# Patient Record
Sex: Male | Born: 1975 | Race: White | Hispanic: No | Marital: Married | Smoking: Never smoker
Health system: Southern US, Community
[De-identification: ages and names within clinical notes are randomized; demographics above are authoritative.]

## PROBLEM LIST (undated history)

## (undated) ENCOUNTER — Ambulatory Visit (HOSPITAL_COMMUNITY): Admission: EM | Source: Home / Self Care

## (undated) VITALS — BP 115/86 | HR 82 | Temp 97.4°F | Resp 18 | Ht 72.0 in | Wt 201.0 lb

## (undated) DIAGNOSIS — G43909 Migraine, unspecified, not intractable, without status migrainosus: Secondary | ICD-10-CM

---

## 2020-04-04 ENCOUNTER — Ambulatory Visit (INDEPENDENT_AMBULATORY_CARE_PROVIDER_SITE_OTHER): Admitting: Psychology

## 2020-04-04 DIAGNOSIS — F332 Major depressive disorder, recurrent severe without psychotic features: Secondary | ICD-10-CM

## 2020-04-12 ENCOUNTER — Ambulatory Visit (INDEPENDENT_AMBULATORY_CARE_PROVIDER_SITE_OTHER): Admitting: Psychology

## 2020-04-12 DIAGNOSIS — F332 Major depressive disorder, recurrent severe without psychotic features: Secondary | ICD-10-CM | POA: Diagnosis not present

## 2020-04-19 ENCOUNTER — Ambulatory Visit (INDEPENDENT_AMBULATORY_CARE_PROVIDER_SITE_OTHER): Admitting: Psychology

## 2020-04-19 DIAGNOSIS — F332 Major depressive disorder, recurrent severe without psychotic features: Secondary | ICD-10-CM

## 2020-04-19 DIAGNOSIS — F4312 Post-traumatic stress disorder, chronic: Secondary | ICD-10-CM

## 2020-04-26 ENCOUNTER — Ambulatory Visit (INDEPENDENT_AMBULATORY_CARE_PROVIDER_SITE_OTHER): Admitting: Psychology

## 2020-04-26 DIAGNOSIS — F4312 Post-traumatic stress disorder, chronic: Secondary | ICD-10-CM

## 2020-04-26 DIAGNOSIS — F332 Major depressive disorder, recurrent severe without psychotic features: Secondary | ICD-10-CM | POA: Diagnosis not present

## 2020-05-10 ENCOUNTER — Ambulatory Visit (INDEPENDENT_AMBULATORY_CARE_PROVIDER_SITE_OTHER): Admitting: Psychology

## 2020-05-10 DIAGNOSIS — F332 Major depressive disorder, recurrent severe without psychotic features: Secondary | ICD-10-CM

## 2020-05-10 DIAGNOSIS — F4312 Post-traumatic stress disorder, chronic: Secondary | ICD-10-CM | POA: Diagnosis not present

## 2020-05-16 ENCOUNTER — Ambulatory Visit (INDEPENDENT_AMBULATORY_CARE_PROVIDER_SITE_OTHER): Admitting: Psychology

## 2020-05-16 DIAGNOSIS — F4312 Post-traumatic stress disorder, chronic: Secondary | ICD-10-CM

## 2020-05-16 DIAGNOSIS — F332 Major depressive disorder, recurrent severe without psychotic features: Secondary | ICD-10-CM

## 2020-05-23 ENCOUNTER — Ambulatory Visit (INDEPENDENT_AMBULATORY_CARE_PROVIDER_SITE_OTHER): Admitting: Psychology

## 2020-05-23 DIAGNOSIS — F332 Major depressive disorder, recurrent severe without psychotic features: Secondary | ICD-10-CM | POA: Diagnosis not present

## 2020-05-23 DIAGNOSIS — F4312 Post-traumatic stress disorder, chronic: Secondary | ICD-10-CM

## 2020-06-01 ENCOUNTER — Ambulatory Visit (INDEPENDENT_AMBULATORY_CARE_PROVIDER_SITE_OTHER): Admitting: Psychology

## 2020-06-01 DIAGNOSIS — F4312 Post-traumatic stress disorder, chronic: Secondary | ICD-10-CM | POA: Diagnosis not present

## 2020-06-01 DIAGNOSIS — F332 Major depressive disorder, recurrent severe without psychotic features: Secondary | ICD-10-CM

## 2020-06-08 ENCOUNTER — Ambulatory Visit (INDEPENDENT_AMBULATORY_CARE_PROVIDER_SITE_OTHER): Admitting: Psychology

## 2020-06-08 DIAGNOSIS — F332 Major depressive disorder, recurrent severe without psychotic features: Secondary | ICD-10-CM | POA: Diagnosis not present

## 2020-06-08 DIAGNOSIS — F4312 Post-traumatic stress disorder, chronic: Secondary | ICD-10-CM | POA: Diagnosis not present

## 2020-06-15 ENCOUNTER — Ambulatory Visit (INDEPENDENT_AMBULATORY_CARE_PROVIDER_SITE_OTHER): Admitting: Psychology

## 2020-06-15 DIAGNOSIS — F4312 Post-traumatic stress disorder, chronic: Secondary | ICD-10-CM | POA: Diagnosis not present

## 2020-06-15 DIAGNOSIS — F332 Major depressive disorder, recurrent severe without psychotic features: Secondary | ICD-10-CM

## 2020-06-22 ENCOUNTER — Ambulatory Visit (INDEPENDENT_AMBULATORY_CARE_PROVIDER_SITE_OTHER): Admitting: Psychology

## 2020-06-22 DIAGNOSIS — F332 Major depressive disorder, recurrent severe without psychotic features: Secondary | ICD-10-CM

## 2020-06-22 DIAGNOSIS — F4312 Post-traumatic stress disorder, chronic: Secondary | ICD-10-CM | POA: Diagnosis not present

## 2020-06-30 ENCOUNTER — Ambulatory Visit (INDEPENDENT_AMBULATORY_CARE_PROVIDER_SITE_OTHER): Admitting: Psychology

## 2020-06-30 DIAGNOSIS — F4312 Post-traumatic stress disorder, chronic: Secondary | ICD-10-CM

## 2020-06-30 DIAGNOSIS — F332 Major depressive disorder, recurrent severe without psychotic features: Secondary | ICD-10-CM

## 2020-07-07 ENCOUNTER — Ambulatory Visit (INDEPENDENT_AMBULATORY_CARE_PROVIDER_SITE_OTHER): Admitting: Psychology

## 2020-07-07 DIAGNOSIS — F4312 Post-traumatic stress disorder, chronic: Secondary | ICD-10-CM

## 2020-07-14 ENCOUNTER — Ambulatory Visit (INDEPENDENT_AMBULATORY_CARE_PROVIDER_SITE_OTHER): Admitting: Psychology

## 2020-07-14 DIAGNOSIS — F4312 Post-traumatic stress disorder, chronic: Secondary | ICD-10-CM | POA: Diagnosis not present

## 2020-07-14 DIAGNOSIS — F332 Major depressive disorder, recurrent severe without psychotic features: Secondary | ICD-10-CM | POA: Diagnosis not present

## 2020-07-21 ENCOUNTER — Ambulatory Visit (INDEPENDENT_AMBULATORY_CARE_PROVIDER_SITE_OTHER): Admitting: Psychology

## 2020-07-21 DIAGNOSIS — F4312 Post-traumatic stress disorder, chronic: Secondary | ICD-10-CM | POA: Diagnosis not present

## 2020-07-21 DIAGNOSIS — F332 Major depressive disorder, recurrent severe without psychotic features: Secondary | ICD-10-CM | POA: Diagnosis not present

## 2020-07-26 ENCOUNTER — Ambulatory Visit (INDEPENDENT_AMBULATORY_CARE_PROVIDER_SITE_OTHER): Admitting: Psychology

## 2020-07-26 DIAGNOSIS — F4312 Post-traumatic stress disorder, chronic: Secondary | ICD-10-CM | POA: Diagnosis not present

## 2020-07-26 DIAGNOSIS — F332 Major depressive disorder, recurrent severe without psychotic features: Secondary | ICD-10-CM | POA: Diagnosis not present

## 2020-08-03 ENCOUNTER — Ambulatory Visit: Admitting: Psychology

## 2020-08-10 ENCOUNTER — Ambulatory Visit (INDEPENDENT_AMBULATORY_CARE_PROVIDER_SITE_OTHER): Admitting: Psychology

## 2020-08-10 DIAGNOSIS — F332 Major depressive disorder, recurrent severe without psychotic features: Secondary | ICD-10-CM | POA: Diagnosis not present

## 2020-08-10 DIAGNOSIS — F4312 Post-traumatic stress disorder, chronic: Secondary | ICD-10-CM | POA: Diagnosis not present

## 2020-08-17 ENCOUNTER — Ambulatory Visit (INDEPENDENT_AMBULATORY_CARE_PROVIDER_SITE_OTHER): Admitting: Psychology

## 2020-08-17 DIAGNOSIS — F4312 Post-traumatic stress disorder, chronic: Secondary | ICD-10-CM | POA: Diagnosis not present

## 2020-08-17 DIAGNOSIS — F332 Major depressive disorder, recurrent severe without psychotic features: Secondary | ICD-10-CM

## 2020-08-30 ENCOUNTER — Ambulatory Visit (INDEPENDENT_AMBULATORY_CARE_PROVIDER_SITE_OTHER): Admitting: Psychology

## 2020-08-30 DIAGNOSIS — F332 Major depressive disorder, recurrent severe without psychotic features: Secondary | ICD-10-CM | POA: Diagnosis not present

## 2020-08-30 DIAGNOSIS — F4312 Post-traumatic stress disorder, chronic: Secondary | ICD-10-CM

## 2020-09-05 ENCOUNTER — Ambulatory Visit (INDEPENDENT_AMBULATORY_CARE_PROVIDER_SITE_OTHER): Admitting: Psychology

## 2020-09-05 DIAGNOSIS — F4312 Post-traumatic stress disorder, chronic: Secondary | ICD-10-CM | POA: Diagnosis not present

## 2020-09-05 DIAGNOSIS — F332 Major depressive disorder, recurrent severe without psychotic features: Secondary | ICD-10-CM | POA: Diagnosis not present

## 2020-09-14 ENCOUNTER — Ambulatory Visit (INDEPENDENT_AMBULATORY_CARE_PROVIDER_SITE_OTHER): Admitting: Psychology

## 2020-09-14 DIAGNOSIS — F4312 Post-traumatic stress disorder, chronic: Secondary | ICD-10-CM

## 2020-09-14 DIAGNOSIS — F332 Major depressive disorder, recurrent severe without psychotic features: Secondary | ICD-10-CM | POA: Diagnosis not present

## 2020-10-12 ENCOUNTER — Ambulatory Visit (INDEPENDENT_AMBULATORY_CARE_PROVIDER_SITE_OTHER): Admitting: Psychology

## 2020-10-12 DIAGNOSIS — F332 Major depressive disorder, recurrent severe without psychotic features: Secondary | ICD-10-CM

## 2020-10-12 DIAGNOSIS — F4312 Post-traumatic stress disorder, chronic: Secondary | ICD-10-CM

## 2020-11-08 ENCOUNTER — Ambulatory Visit (INDEPENDENT_AMBULATORY_CARE_PROVIDER_SITE_OTHER): Admitting: Psychology

## 2020-11-08 DIAGNOSIS — F4312 Post-traumatic stress disorder, chronic: Secondary | ICD-10-CM

## 2020-11-08 DIAGNOSIS — F332 Major depressive disorder, recurrent severe without psychotic features: Secondary | ICD-10-CM | POA: Diagnosis not present

## 2020-11-15 ENCOUNTER — Ambulatory Visit (INDEPENDENT_AMBULATORY_CARE_PROVIDER_SITE_OTHER): Admitting: Psychology

## 2020-11-15 DIAGNOSIS — F4312 Post-traumatic stress disorder, chronic: Secondary | ICD-10-CM

## 2020-11-15 DIAGNOSIS — F332 Major depressive disorder, recurrent severe without psychotic features: Secondary | ICD-10-CM | POA: Diagnosis not present

## 2020-11-16 ENCOUNTER — Ambulatory Visit: Admitting: Psychology

## 2020-11-30 ENCOUNTER — Ambulatory Visit (INDEPENDENT_AMBULATORY_CARE_PROVIDER_SITE_OTHER): Admitting: Psychology

## 2020-11-30 DIAGNOSIS — F4312 Post-traumatic stress disorder, chronic: Secondary | ICD-10-CM

## 2020-11-30 DIAGNOSIS — F332 Major depressive disorder, recurrent severe without psychotic features: Secondary | ICD-10-CM | POA: Diagnosis not present

## 2020-12-08 ENCOUNTER — Ambulatory Visit (INDEPENDENT_AMBULATORY_CARE_PROVIDER_SITE_OTHER): Admitting: Psychology

## 2020-12-08 DIAGNOSIS — F332 Major depressive disorder, recurrent severe without psychotic features: Secondary | ICD-10-CM | POA: Diagnosis not present

## 2020-12-08 DIAGNOSIS — F4312 Post-traumatic stress disorder, chronic: Secondary | ICD-10-CM | POA: Diagnosis not present

## 2020-12-13 ENCOUNTER — Ambulatory Visit (INDEPENDENT_AMBULATORY_CARE_PROVIDER_SITE_OTHER): Admitting: Psychology

## 2020-12-13 DIAGNOSIS — F332 Major depressive disorder, recurrent severe without psychotic features: Secondary | ICD-10-CM

## 2020-12-13 DIAGNOSIS — F4312 Post-traumatic stress disorder, chronic: Secondary | ICD-10-CM | POA: Diagnosis not present

## 2020-12-19 ENCOUNTER — Ambulatory Visit (INDEPENDENT_AMBULATORY_CARE_PROVIDER_SITE_OTHER): Admitting: Psychology

## 2020-12-19 DIAGNOSIS — F4312 Post-traumatic stress disorder, chronic: Secondary | ICD-10-CM | POA: Diagnosis not present

## 2020-12-19 DIAGNOSIS — F332 Major depressive disorder, recurrent severe without psychotic features: Secondary | ICD-10-CM | POA: Diagnosis not present

## 2020-12-27 ENCOUNTER — Ambulatory Visit (INDEPENDENT_AMBULATORY_CARE_PROVIDER_SITE_OTHER): Admitting: Psychology

## 2020-12-27 DIAGNOSIS — F4312 Post-traumatic stress disorder, chronic: Secondary | ICD-10-CM | POA: Diagnosis not present

## 2020-12-27 DIAGNOSIS — F332 Major depressive disorder, recurrent severe without psychotic features: Secondary | ICD-10-CM | POA: Diagnosis not present

## 2021-01-02 ENCOUNTER — Ambulatory Visit (INDEPENDENT_AMBULATORY_CARE_PROVIDER_SITE_OTHER): Admitting: Psychology

## 2021-01-02 DIAGNOSIS — F4312 Post-traumatic stress disorder, chronic: Secondary | ICD-10-CM

## 2021-01-02 DIAGNOSIS — F332 Major depressive disorder, recurrent severe without psychotic features: Secondary | ICD-10-CM

## 2021-01-10 ENCOUNTER — Ambulatory Visit (INDEPENDENT_AMBULATORY_CARE_PROVIDER_SITE_OTHER): Admitting: Psychology

## 2021-01-10 DIAGNOSIS — F332 Major depressive disorder, recurrent severe without psychotic features: Secondary | ICD-10-CM

## 2021-01-10 DIAGNOSIS — F4312 Post-traumatic stress disorder, chronic: Secondary | ICD-10-CM

## 2021-01-16 ENCOUNTER — Ambulatory Visit (INDEPENDENT_AMBULATORY_CARE_PROVIDER_SITE_OTHER): Admitting: Psychology

## 2021-01-16 DIAGNOSIS — F332 Major depressive disorder, recurrent severe without psychotic features: Secondary | ICD-10-CM | POA: Diagnosis not present

## 2021-01-16 DIAGNOSIS — F4312 Post-traumatic stress disorder, chronic: Secondary | ICD-10-CM

## 2021-01-25 ENCOUNTER — Ambulatory Visit (INDEPENDENT_AMBULATORY_CARE_PROVIDER_SITE_OTHER): Admitting: Psychology

## 2021-01-25 DIAGNOSIS — F332 Major depressive disorder, recurrent severe without psychotic features: Secondary | ICD-10-CM | POA: Diagnosis not present

## 2021-01-25 DIAGNOSIS — F4312 Post-traumatic stress disorder, chronic: Secondary | ICD-10-CM | POA: Diagnosis not present

## 2021-02-01 ENCOUNTER — Ambulatory Visit (INDEPENDENT_AMBULATORY_CARE_PROVIDER_SITE_OTHER): Admitting: Psychology

## 2021-02-01 DIAGNOSIS — F332 Major depressive disorder, recurrent severe without psychotic features: Secondary | ICD-10-CM

## 2021-02-01 DIAGNOSIS — F4312 Post-traumatic stress disorder, chronic: Secondary | ICD-10-CM

## 2021-02-09 ENCOUNTER — Ambulatory Visit (INDEPENDENT_AMBULATORY_CARE_PROVIDER_SITE_OTHER): Admitting: Psychology

## 2021-02-09 DIAGNOSIS — F332 Major depressive disorder, recurrent severe without psychotic features: Secondary | ICD-10-CM

## 2021-02-09 DIAGNOSIS — F4312 Post-traumatic stress disorder, chronic: Secondary | ICD-10-CM

## 2021-02-14 ENCOUNTER — Ambulatory Visit (INDEPENDENT_AMBULATORY_CARE_PROVIDER_SITE_OTHER): Admitting: Psychology

## 2021-02-14 DIAGNOSIS — F4312 Post-traumatic stress disorder, chronic: Secondary | ICD-10-CM

## 2021-02-14 DIAGNOSIS — F332 Major depressive disorder, recurrent severe without psychotic features: Secondary | ICD-10-CM

## 2021-02-16 ENCOUNTER — Inpatient Hospital Stay (HOSPITAL_COMMUNITY)
Admission: RE | Admit: 2021-02-16 | Discharge: 2021-02-23 | DRG: 885 | Disposition: A | Discharge status: Home / Self Care | Attending: Psychiatry | Admitting: Psychiatry

## 2021-02-16 ENCOUNTER — Other Ambulatory Visit: Payer: Self-pay

## 2021-02-16 ENCOUNTER — Encounter (HOSPITAL_COMMUNITY): Payer: Self-pay | Admitting: Psychiatry

## 2021-02-16 DIAGNOSIS — F429 Obsessive-compulsive disorder, unspecified: Secondary | ICD-10-CM | POA: Diagnosis present

## 2021-02-16 DIAGNOSIS — F419 Anxiety disorder, unspecified: Secondary | ICD-10-CM | POA: Diagnosis present

## 2021-02-16 DIAGNOSIS — F319 Bipolar disorder, unspecified: Secondary | ICD-10-CM | POA: Diagnosis present

## 2021-02-16 DIAGNOSIS — F411 Generalized anxiety disorder: Secondary | ICD-10-CM | POA: Diagnosis present

## 2021-02-16 DIAGNOSIS — I10 Essential (primary) hypertension: Secondary | ICD-10-CM | POA: Diagnosis present

## 2021-02-16 DIAGNOSIS — F3113 Bipolar disorder, current episode manic without psychotic features, severe: Secondary | ICD-10-CM | POA: Diagnosis present

## 2021-02-16 DIAGNOSIS — F332 Major depressive disorder, recurrent severe without psychotic features: Secondary | ICD-10-CM | POA: Diagnosis present

## 2021-02-16 DIAGNOSIS — G43909 Migraine, unspecified, not intractable, without status migrainosus: Secondary | ICD-10-CM | POA: Diagnosis present

## 2021-02-16 DIAGNOSIS — F909 Attention-deficit hyperactivity disorder, unspecified type: Secondary | ICD-10-CM | POA: Diagnosis present

## 2021-02-16 DIAGNOSIS — Z20822 Contact with and (suspected) exposure to covid-19: Secondary | ICD-10-CM | POA: Diagnosis present

## 2021-02-16 DIAGNOSIS — G4700 Insomnia, unspecified: Secondary | ICD-10-CM | POA: Diagnosis present

## 2021-02-16 DIAGNOSIS — Z79899 Other long term (current) drug therapy: Secondary | ICD-10-CM | POA: Diagnosis not present

## 2021-02-16 LAB — RESP PANEL BY RT-PCR (FLU A&B, COVID) ARPGX2
Influenza A by PCR: NEGATIVE
Influenza B by PCR: NEGATIVE
SARS Coronavirus 2 by RT PCR: NEGATIVE

## 2021-02-16 MED ORDER — LAMOTRIGINE 100 MG PO TABS
200.0000 mg | ORAL_TABLET | Freq: Two times a day (BID) | ORAL | Status: DC
  Administered 2021-02-16 – 2021-02-23 (×14): 200 mg via ORAL
  Filled 2021-02-16 (×8): qty 1
  Filled 2021-02-16: qty 2
  Filled 2021-02-16 (×6): qty 1
  Filled 2021-02-16: qty 2
  Filled 2021-02-16 (×2): qty 1

## 2021-02-16 MED ORDER — ALUM & MAG HYDROXIDE-SIMETH 200-200-20 MG/5ML PO SUSP: 30.0000 mL | ORAL | Status: DC | PRN

## 2021-02-16 MED ORDER — PROPRANOLOL HCL 40 MG PO TABS
40.0000 mg | ORAL_TABLET | Freq: Two times a day (BID) | ORAL | Status: DC
  Administered 2021-02-16 – 2021-02-23 (×14): 40 mg via ORAL
  Filled 2021-02-16 (×15): qty 1
  Filled 2021-02-16: qty 4
  Filled 2021-02-16: qty 1

## 2021-02-16 MED ORDER — ACETAMINOPHEN 325 MG PO TABS
650.0000 mg | ORAL_TABLET | Freq: Four times a day (QID) | ORAL | Status: DC | PRN
  Filled 2021-02-16: qty 2

## 2021-02-16 MED ORDER — TRAZODONE HCL 50 MG PO TABS
50.0000 mg | ORAL_TABLET | Freq: Once | ORAL | Status: AC | PRN
  Administered 2021-02-16: 50 mg via ORAL
  Filled 2021-02-16: qty 1

## 2021-02-16 MED ORDER — NICOTINE POLACRILEX 2 MG MT GUM
2.0000 mg | CHEWING_GUM | OROMUCOSAL | Status: DC | PRN
  Administered 2021-02-17 – 2021-02-23 (×25): 2 mg via ORAL
  Filled 2021-02-16 (×5): qty 1

## 2021-02-16 MED ORDER — RISPERIDONE 1 MG PO TABS
1.0000 mg | ORAL_TABLET | Freq: Every morning | ORAL | Status: DC
  Administered 2021-02-17 – 2021-02-19 (×3): 1 mg via ORAL
  Filled 2021-02-16 (×5): qty 1

## 2021-02-16 MED ORDER — HYDROXYZINE HCL 25 MG PO TABS
25.0000 mg | ORAL_TABLET | Freq: Three times a day (TID) | ORAL | Status: DC | PRN
  Administered 2021-02-16 – 2021-02-22 (×7): 25 mg via ORAL
  Filled 2021-02-16: qty 10
  Filled 2021-02-16 (×7): qty 1

## 2021-02-16 MED ORDER — RISPERIDONE 0.5 MG PO TABS
0.5000 mg | ORAL_TABLET | Freq: Every day | ORAL | Status: DC
  Administered 2021-02-16: 0.5 mg via ORAL
  Filled 2021-02-16 (×4): qty 1

## 2021-02-16 MED ORDER — MAGNESIUM HYDROXIDE 400 MG/5ML PO SUSP
30.0000 mL | Freq: Every day | ORAL | Status: DC | PRN
  Administered 2021-02-22: 30 mL via ORAL
  Filled 2021-02-16: qty 30
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
## 2021-02-16 NOTE — Progress Notes (Signed)
[facility] Group Notes:  (Nursing/MHT/Case Management/Adjunct)  Date:  02/16/2021  Time:  2015  Type of Therapy:  wrap up gorup  Participation Level:  Active  Participation Quality:  Appropriate, Attentive and Sharing  Affect:  Anxious  Cognitive:  Alert  Insight:  Improving  Engagement in Group:  Engaged  Modes of Intervention:  Clarification, Education and Support  Summary of Progress/Problems: Positive thinking and positive change were discussed.   Marcille Buffy 02/16/2021, 9:13 PM
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
## 2021-02-16 NOTE — Progress Notes (Signed)
Admission Note: Patient is a 45 year old male who presents as a walk-in accompanied by spouse with symptoms of paranoia and anxiety.  Patient believed his wife of 13 years is cheating on him with her coworker.  Patient denies SI/HI and audiovisual hallucinations.  Blames his wife for coming to the hospital.  States that he only want his wife to answer "yes or no" to his questions but refused.  States he is not going back home until she answers his questions.  Patient presents with anxious affect and mood.  Admission plan of care reviewed and consent for treatment signed.  Skin assessment and personal belongings completed.  Skin is dry and intact.  No contraband found.  Patient oriented to the unit, staff and room.  Routine safety checks initiated.  Verbalized understanding of unit rules/ protocols.  Patient is safe on the unit.

## 2021-02-16 NOTE — Tx Team (Signed)
Initial Treatment Plan 02/16/2021 5:35 PM Andrew Kerr VEH:[id number]    PATIENT STRESSORS: Health problems Marital or family conflict   PATIENT STRENGTHS: Ability for insight Communication skills Motivation for treatment/growth Supportive family/friends   PATIENT IDENTIFIED PROBLEMS: "Mental health issues"  Paranoia  Depression  Anxiety               DISCHARGE CRITERIA:  Ability to meet basic life and health needs Adequate post-discharge living arrangements Motivation to continue treatment in a less acute level of care  PRELIMINARY DISCHARGE PLAN: Attend aftercare/continuing care group Outpatient therapy Return to previous living arrangement  PATIENT/FAMILY INVOLVEMENT: This treatment plan has been presented to and reviewed with the patient, Andrew Kerr.  The patient and family have been given the opportunity to ask questions and make suggestions.  Clarene Critchley, RN 02/16/2021, 5:35 PM

## 2021-02-17 ENCOUNTER — Encounter (HOSPITAL_COMMUNITY): Payer: Self-pay | Admitting: Psychiatry

## 2021-02-17 DIAGNOSIS — F419 Anxiety disorder, unspecified: Secondary | ICD-10-CM

## 2021-02-17 DIAGNOSIS — F3113 Bipolar disorder, current episode manic without psychotic features, severe: Secondary | ICD-10-CM

## 2021-02-17 HISTORY — DX: Bipolar disorder, current episode manic without psychotic features, severe: F31.13

## 2021-02-17 HISTORY — DX: Anxiety disorder, unspecified: F41.9

## 2021-02-17 LAB — TSH: TSH: 3.708 u[IU]/mL (ref 0.350–4.500)

## 2021-02-17 LAB — COMPREHENSIVE METABOLIC PANEL
ALT: 19 U/L (ref 0–44)
AST: 19 U/L (ref 15–41)
Albumin: 4.6 g/dL (ref 3.5–5.0)
Alkaline Phosphatase: 59 U/L (ref 38–126)
Anion gap: 9 (ref 5–15)
BUN: 12 mg/dL (ref 6–20)
CO2: 26 mmol/L (ref 22–32)
Calcium: 9.2 mg/dL (ref 8.9–10.3)
Chloride: 104 mmol/L (ref 98–111)
Creatinine, Ser: 1.06 mg/dL (ref 0.61–1.24)
GFR, Estimated: >60 mL/min (ref >60)
Glucose, Bld: 112 mg/dL — ABNORMAL HIGH (ref 70–99)
Potassium: 3.5 mmol/L (ref 3.5–5.1)
Sodium: 139 mmol/L (ref 135–145)
Total Bilirubin: 0.7 mg/dL (ref 0.3–1.2)
Total Protein: 7.4 g/dL (ref 6.5–8.1)

## 2021-02-17 LAB — CBC
HCT: 43.7 % (ref 39.0–52.0)
Hemoglobin: 14.9 g/dL (ref 13.0–17.0)
MCH: 32.3 pg (ref 26.0–34.0)
MCHC: 34.1 g/dL (ref 30.0–36.0)
MCV: 94.8 fL (ref 80.0–100.0)
Platelets: 274 10*3/uL (ref 150–400)
RBC: 4.61 MIL/uL (ref 4.22–5.81)
RDW: 12.4 % (ref 11.5–15.5)
WBC: 4.7 10*3/uL (ref 4.0–10.5)
nRBC: 0 % (ref 0.0–0.2)

## 2021-02-17 LAB — RAPID URINE DRUG SCREEN, HOSP PERFORMED
Amphetamines: NOT DETECTED
Barbiturates: NOT DETECTED
Benzodiazepines: NOT DETECTED
Cocaine: NOT DETECTED
Opiates: NOT DETECTED
Tetrahydrocannabinol: NOT DETECTED

## 2021-02-17 LAB — HEMOGLOBIN A1C
Hgb A1c MFr Bld: 5.3 % (ref 4.8–5.6)
Mean Plasma Glucose: 105.41 mg/dL

## 2021-02-17 LAB — LIPID PANEL
Cholesterol: 202 mg/dL — ABNORMAL HIGH (ref 0–200)
HDL: 36 mg/dL — ABNORMAL LOW (ref >40)
LDL Cholesterol: 146 mg/dL — ABNORMAL HIGH (ref 0–99)
Total CHOL/HDL Ratio: 5.6 RATIO
Triglycerides: 101 mg/dL (ref <150)
VLDL: 20 mg/dL (ref 0–40)

## 2021-02-17 MED ORDER — RISPERIDONE 1 MG PO TABS
1.0000 mg | ORAL_TABLET | Freq: Every day | ORAL | Status: DC
  Administered 2021-02-17 – 2021-02-19 (×3): 1 mg via ORAL
  Filled 2021-02-17 (×4): qty 1

## 2021-02-17 NOTE — B[facility] Group Notes (Signed)
  Type of Therapy and Topic: Group Therapy: Anger Management   Participation: Active  Description of Group: In this group, patients will learn helpful strategies and techniques to manage anger, express anger in alternative ways, change hostile attitudes, and prevent aggressive acts, such as verbal abuse and violence.This group will be process-oriented and eductional, with patients participating in exploration of their own experiences as well as giving and receiving support and challenge from other group members.  Therapeutic Goals: 1. Patient will learn to manage anger. 2. Patient will learn to stop violence or the threat of violence. 3. Patient will learn to develop self control over thoughts and actions. 4. Patient will receive support and feedback from others  CSW provided worksheet packets for group members and answered any questions that were asked during this time.   Therapeutic Modalities: Cognitive Behavioral Therapy Solution Focused Therapy Motivational Interviewing

## 2021-02-17 NOTE — Tx Team (Signed)
Interdisciplinary Treatment and Diagnostic Plan Update  02/17/2021 Time of Session:  Jatin Naumann MRN: [id number]  Principal Diagnosis: Bipolar I disorder, current or most recent episode manic, severe ([place])  Secondary Diagnoses: Principal Problem:   Bipolar I disorder, current or most recent episode manic, severe ([place]) Active Problems:   MDD (major depressive disorder), recurrent episode, severe ([place])   Anxiety disorder, unspecified   Current Medications:  Current Facility-Administered Medications  Medication Dose Route Frequency Provider Last Rate Last Admin  . acetaminophen (TYLENOL) tablet 650 mg  650 mg Oral Q6H PRN Revonda Humphrey, NP      . alum & mag hydroxide-simeth (MAALOX/MYLANTA) 200-200-20 MG/5ML suspension 30 mL  30 mL Oral Q4H PRN Revonda Humphrey, NP      . hydrOXYzine (ATARAX/VISTARIL) tablet 25 mg  25 mg Oral TID PRN Revonda Humphrey, NP   25 mg at 02/16/21 2119  . lamoTRIgine (LAMICTAL) tablet 200 mg  200 mg Oral BID Revonda Humphrey, NP   200 mg at 02/17/21 0748  . magnesium hydroxide (MILK OF MAGNESIA) suspension 30 mL  30 mL Oral Daily PRN Revonda Humphrey, NP      . nicotine polacrilex (NICORETTE) gum 2 mg  2 mg Oral PRN Lindell Spar I, NP   2 mg at 02/17/21 0748  . propranolol (INDERAL) tablet 40 mg  40 mg Oral BID Revonda Humphrey, NP   40 mg at 02/17/21 0747  . risperiDONE (RISPERDAL) tablet 1 mg  1 mg Oral q morning Revonda Humphrey, NP      . risperiDONE (RISPERDAL) tablet 1 mg  1 mg Oral QHS Arthor Captain, MD       PTA Medications: Medications Prior to Admission  Medication Sig Dispense Refill Last Dose  . lamoTRIgine (LAMICTAL) 200 MG tablet Take 200 mg by mouth 2 (two) times daily.     . propranolol (INDERAL) 20 MG tablet Take 40 mg by mouth 2 (two) times daily.     . risperiDONE (RISPERDAL) 1 MG tablet Take 0.5 mg by mouth in the morning and at bedtime. Take one tablet every morning and half a tablet at bedtime.     Marland Kitchen VYVANSE 50 MG CHEW  Chew 25 mg by mouth 2 (two) times daily at 8am and 2pm.       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   Physician Treatment Plan for Primary Diagnosis: Bipolar I disorder, current or most recent episode manic, severe ([place]) Long Term Goal(s): Improvement in symptoms so as ready for discharge   Short Term Goals: Ability to identify changes in lifestyle to reduce recurrence of condition will improve Ability to verbalize feelings will improve Ability to demonstrate self-control will improve Ability to identify and develop effective coping behaviors will improve Ability to identify triggers associated with substance abuse/mental health issues will improve  Medication Management: Evaluate patient's response, side effects, and tolerance of medication regimen.  Therapeutic Interventions: 1 to 1 sessions, Unit Group sessions and Medication administration.  Evaluation of Outcomes: Not Met  Physician Treatment Plan for Secondary Diagnosis: Principal Problem:   Bipolar I disorder, current or most recent episode manic, severe ([place]) Active Problems:   MDD (major depressive disorder), recurrent episode, severe ([place])   Anxiety disorder, unspecified  Long Term Goal(s): Improvement in symptoms  so as ready for discharge   Short Term Goals: Ability to identify changes in lifestyle to reduce recurrence of condition will improve Ability to verbalize feelings will improve Ability to demonstrate self-control will improve Ability to identify and develop effective coping behaviors will improve Ability to identify triggers associated with substance abuse/mental health issues will improve     Medication Management:  Evaluate patient's response, side effects, and tolerance of medication regimen.  Therapeutic Interventions: 1 to 1 sessions, Unit Group sessions and Medication administration.  Evaluation of Outcomes: Not Met   RN Treatment Plan for Primary Diagnosis: Bipolar I disorder, current or most recent episode manic, severe ([place]) Long Term Goal(s): Knowledge of disease and therapeutic regimen to maintain health will improve  Short Term Goals: Ability to participate in decision making will improve, Ability to verbalize feelings will improve and Ability to identify and develop effective coping behaviors will improve  Medication Management: RN will administer medications as ordered by provider, will assess and evaluate patient's response and provide education to patient for prescribed medication. RN will report any adverse and/or side effects to prescribing provider.  Therapeutic Interventions: 1 on 1 counseling sessions, Psychoeducation, Medication administration, Evaluate responses to treatment, Monitor vital signs and CBGs as ordered, Perform/monitor CIWA, COWS, AIMS and Fall Risk screenings as ordered, Perform wound care treatments as ordered.  Evaluation of Outcomes: Not Met   LCSW Treatment Plan for Primary Diagnosis: Bipolar I disorder, current or most recent episode manic, severe ([place]) Long Term Goal(s): Safe transition to appropriate next level of care at discharge, Engage patient in therapeutic group addressing interpersonal concerns.  Short Term Goals: Engage patient in aftercare planning with referrals and resources, Increase social support and Increase emotional regulation  Therapeutic Interventions: Assess for all discharge needs, 1 to 1 time with Social worker, Explore available resources and support systems, Assess for adequacy in community support network, Educate family and significant other(s) on suicide prevention, Complete Psychosocial Assessment, Interpersonal group  therapy.  Evaluation of Outcomes: Not Met   Progress in Treatment: Attending groups: No. Participating in groups: No. Taking medication as prescribed: Yes. Toleration medication: Yes. Family/Significant other contact made: No, will contact:  pt's wife Patient understands diagnosis: No. Discussing patient identified problems/goals with staff: Yes. Medical problems stabilized or resolved: Yes. Denies suicidal/homicidal ideation: Yes. Issues/concerns per patient self-inventory: No. Other: None  New problem(s) identified: No, Describe:  None  New Short Term/Long Term Goal(s):medication stabilization, elimination of SI thoughts, development of comprehensive mental wellness plan.  Patient Goals:  "to go home"  Discharge Plan or Barriers: Patient recently admitted. CSW will continue to follow and assess for appropriate referrals and possible discharge planning.  Reason for Continuation of Hospitalization: Aggression Mania Medication stabilization  Estimated Length of Stay: 3-5 days  Attendees: Patient: Andrew Kerr 02/17/2021   Physician:  02/17/2021   Nursing:  02/17/2021   RN Care Manager: 02/17/2021   Social Worker: Toney Reil, Latanya Presser 02/17/2021   Recreational Therapist:  02/17/2021   Other:  02/17/2021   Other:  02/17/2021   Other: 02/17/2021       Scribe for Treatment Team: Mliss Fritz, Latanya Presser 02/17/2021 1:56 PM

## 2021-02-17 NOTE — B[facility] Counselor (Signed)
Adult Comprehensive Assessment  Patient ID: Andrew Kerr, male   DOB: Jan 19, 1976, 45 y.o.   MRN: [id number]  Information Source: Information source: Patient  Current Stressors:  Patient states their primary concerns and needs for treatment are:: "Issues with my wife, ongoing mental health issues. I saw 51 messages between my wife and another man." Patient states their goals for this hospitilization and ongoing recovery are:: "To get out"  Living/Environment/Situation:  Living Arrangements: Spouse/significant other,Children Who else lives in the home?: wife and 9 children  Family History:  Marital status: Married Number of Years Married:  (unknown) What types of issues is patient dealing with in the relationship?: Instrusive and racing  thoughts toward spouse Additional relationship information: unknown Are you sexually active?:  (unknown) What is your sexual orientation?: hetero sexual Has your sexual activity been affected by drugs, alcohol, medication, or emotional stress?: unknown Does patient have children?: Yes How many children?: 9 How is patient's relationship with their children?: Per spouse....children are afraid of patien when he displays mood unastability  Childhood History:  By whom was/is the patient raised?: Both parents Additional childhood history information: unknown Description of patient's relationship with caregiver when they were a child: unknown Patient's description of current relationship with people who raised him/her: unknown How were you disciplined when you got in trouble as a child/adolescent?: unknown Does patient have siblings?:  (unknown) Did patient suffer any verbal/emotional/physical/sexual abuse as a child?:  (unknown) Did patient suffer from severe childhood neglect?:  (unknown) Has patient ever been sexually abused/assaulted/raped as an adolescent or adult?:  (unknown) Was the patient ever a victim of a crime or a disaster?:   (unknown) Witnessed domestic violence?:  (unknown) Has patient been affected by domestic violence as an adult?:  (unknown)  Education:     Employment/Work Situation:   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 What is the longest time patient has a held a job?: unknown Where was the patient employed at that time?: [employer] in [place] Has patient ever been in the [employer]?: Yes (Describe in comment) Field seismologist)  Financial Resources:   Financial resources: Private insurance,Income from spouse  Alcohol/Substance Abuse:      Social Support System:      Leisure/Recreation:   Do You Have Hobbies?:  (unknown)  Strengths/Needs:      Discharge Plan:   Currently receiving community mental health services: Yes ([facility]  Summary/Recommendations:  CSW was able to gather little information from pt due to pt obsessing over his wife and the texts with this man. Pt was unable to be redirected. Pt has poor insight on his current state of mental health. While here, Gentry Roch can benefit from crisis stabilization, medication management, therapeutic milieu, and referrals for services.    Felizardo Hoffmann. 02/17/2021

## 2021-02-17 NOTE — Progress Notes (Signed)
   02/17/21 0500  Sleep  Number of Hours 6

## 2021-02-17 NOTE — Progress Notes (Signed)
[facility] presented to serve patient IVC this evening 02/17/21. Three copies obtained and placed on front of patient chart.  
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
## 2021-02-17 NOTE — Progress Notes (Signed)
Pt stated he was glad to be here, pt wants to get his medications right. Pt stated he has sleep apnea and has C-pap machine wife can bring in tomorrow. Pt HOB elevated  the patient given PRN Vistaril and  1 X Trazodone per MAR .     02/17/21 0000  Psych Admission Type (Psych Patients Only)  Admission Status Voluntary  Psychosocial Assessment  Patient Complaints Anxiety  Eye Contact Brief  Facial Expression Anxious  Affect Anxious  Speech Logical/coherent  Interaction Assertive  Motor Activity Slow  Appearance/Hygiene Unremarkable  Behavior Characteristics Cooperative  Mood Anxious;Depressed  Thought Process  Coherency WDL  Content WDL  Delusions Paranoid  Perception Derealization  Hallucination None reported or observed  Judgment Impaired  Confusion None  Danger to Self  Current suicidal ideation? Denies  Danger to Others  Danger to Others None reported or observed

## 2021-02-17 NOTE — Progress Notes (Signed)
Pt was IVC'd during the day. Pt stated he was doing ok and anxious about starting the new medication tomorrow    02/17/21 2200  Psych Admission Type (Psych Patients Only)  Admission Status Involuntary  Psychosocial Assessment  Patient Complaints Anxiety;Worrying  Eye Contact Fair  Facial Expression Anxious;Pensive  Affect Anxious  Speech Press photographer  Appearance/Hygiene Unremarkable  Behavior Characteristics Cooperative  Mood Anxious;Pleasant  Thought Process  Coherency Concrete thinking  Content Blaming others  Delusions None reported or observed  Perception WDL  Hallucination None reported or observed  Judgment Poor  Confusion None  Danger to Self  Current suicidal ideation? Denies  Danger to Others  Danger to Others None reported or observed

## 2021-02-17 NOTE — Progress Notes (Signed)
Progress note    02/17/21 0748  Psych Admission Type (Psych Patients Only)  Admission Status Voluntary  Psychosocial Assessment  Patient Complaints Anxiety;Worrying  Eye Contact Fair  Facial Expression Anxious;Pensive  Affect Anxious  Speech Press photographer  Appearance/Hygiene Unremarkable  Behavior Characteristics Cooperative;Appropriate to situation;Anxious;Fidgety  Mood Anxious;Pleasant  Thought Process  Coherency Concrete thinking  Content Blaming others  Delusions None reported or observed  Perception WDL  Hallucination None reported or observed  Judgment Poor  Confusion None  Danger to Self  Current suicidal ideation? Denies  Danger to Others  Danger to Others None reported or observed

## 2021-02-17 NOTE — Progress Notes (Signed)
Recreation Therapy Notes  Date:  4.29.22 Time: 0935 Location: [address] Hall Dayroom  Group Topic: Stress Management  Goal Area(s) Addresses:  Patient will identify positive stress management techniques. Patient will identify benefits of using stress management post d/c.  Behavioral Response: Engaged  Intervention: Stress Management  Activity:  Meditation.  LRT played a meditation that focused on calming anxiety.  Patients were to listen as the meditation played and focus on their breathing, releasing any tension and what they are feeling.    Education:  Stress Management, Discharge Planning.   Education Outcome: Acknowledges Education  Clinical Observations/Feedback: Pt attended and participated in group session.    Caroll Rancher, LRT/CTRS         Caroll Rancher A 02/17/2021 11:20 AM

## 2021-02-17 NOTE — Plan of Care (Signed)
  Problem: Education: Goal: Knowledge of [facility] Education information/materials will improve Outcome: Progressing Goal: Emotional status will improve Outcome: Progressing Goal: Mental status will improve Outcome: Progressing Goal: Verbalization of understanding the information provided will improve Outcome: Progressing   

## 2021-02-18 DIAGNOSIS — F3113 Bipolar disorder, current episode manic without psychotic features, severe: Secondary | ICD-10-CM

## 2021-02-18 MED ORDER — LITHIUM CARBONATE 300 MG PO CAPS
300.0000 mg | ORAL_CAPSULE | Freq: Two times a day (BID) | ORAL | Status: DC
  Administered 2021-02-18 – 2021-02-20 (×4): 300 mg via ORAL
  Filled 2021-02-18 (×6): qty 1

## 2021-02-18 MED ORDER — TRAZODONE HCL 50 MG PO TABS
50.0000 mg | ORAL_TABLET | Freq: Every evening | ORAL | Status: DC | PRN
  Administered 2021-02-18 – 2021-02-22 (×5): 50 mg via ORAL
  Filled 2021-02-18 (×4): qty 1

## 2021-02-18 NOTE — [facility] Group Notes (Signed)
.  Psychoeducational Group Note  Date: 02/18/2021 Time: 0900-1000    Goal Setting   Purpose of Group: This group helps to provide patients with the steps of setting a goal that is specific, measurable, attainable, realistic and time specific. A discussion on how we keep ourselves stuck with negative self talk.    Participation Level:  Active  Participation Quality:  Appropriate  Affect:  Appropriate  Cognitive:  Appropriate  Insight:  Improving  Engagement in Group:  Engaged  Additional Comments:  Pt rates his energy as a 7/10. Pt states "I was believing things that are not true. I still am not sure. People are telling me things that I believe are true, are not true".  Dione Housekeeper

## 2021-02-18 NOTE — [facility] Group Notes (Signed)
Psychoeducational Group Note    Date:02/18/2021 Time: 1300-1400    Life Skills:  A group where two lists are made. What people need and what are things that we do that are healthy. The lists are developed by the patients and it is explained that we often do the actions that are not healthy to get our list of needs met.   Purpose of Group: . The group focus' on teaching patients on how to identify their needs and how to develop the coping skills needed to get their needs met  Participation Level:  Active  Participation Quality:  Appropriate  Affect:  Appropriate  Cognitive:  Oriented  Insight:  Improving  Engagement in Group:  Engaged  Additional Comments: Pt came late to the group and then was taken out for evaluation by the Doctor. When in the room, Pt interacted and contributed to the colloection of the two lists.   Andrew Kerr

## 2021-02-18 NOTE — Progress Notes (Signed)
[facility] Group Notes:  (Nursing/MHT/Case Management/Adjunct)  Date:  02/18/2021  Time:  2030 Type of Therapy:  wrap up group  Participation Level:  Active  Participation Quality:  Appropriate, Attentive, Sharing and Supportive  Affect:  Depressed and Irritable  Cognitive:  Alert  Insight:  Improving  Engagement in Group:  Engaged  Modes of Intervention:  Clarification, Education and Support  Summary of Progress/Problems: Positive thinking and self-care were discussed.   Andrew Kerr 02/18/2021, 10:45 PM

## 2021-02-18 NOTE — Progress Notes (Signed)
   02/18/21 1700  Vital Signs  Pulse Rate 95  BP 107/79  BP Method Automatic   D: Patient denies SI/HI/AVH. Patient rated anxiety 9/10 and depression 8/10. Pt  Out in open areas and was social with staff and peers.  A:  Patient took scheduled medicine.  Pt.'s nicotine cravings relieved by nicorette. Support and encouragement provided Routine safety checks conducted every 15 minutes. Patient  Informed to notify staff with any concerns.   R: Safety maintained.

## 2021-02-18 NOTE — Progress Notes (Signed)
   02/18/21 0500  Sleep  Number of Hours 6.75   
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
## 2021-02-19 NOTE — Progress Notes (Signed)
   02/19/21 2110  Psych Admission Type (Psych Patients Only)  Admission Status Involuntary  Psychosocial Assessment  Patient Complaints None  Eye Contact Fair  Facial Expression Flat  Affect Appropriate to circumstance;Flat  Speech Logical/coherent  Interaction Assertive  Motor Activity Other (Comment)  Appearance/Hygiene Unremarkable  Behavior Characteristics Cooperative  Mood Pleasant  Thought Process  Coherency WDL  Content WDL  Delusions None reported or observed  Perception WDL  Hallucination None reported or observed  Judgment Poor  Confusion None  Danger to Self  Current suicidal ideation? Denies  Danger to Others  Danger to Others None reported or observed    02/19/21 2110  Psych Admission Type (Psych Patients Only)  Admission Status Involuntary  Psychosocial Assessment  Patient Complaints None  Eye Contact Fair  Facial Expression Flat  Affect Appropriate to circumstance;Flat  Speech Logical/coherent  Interaction Assertive  Motor Activity Other (Comment)  Appearance/Hygiene Unremarkable  Behavior Characteristics Cooperative  Mood Pleasant  Thought Process  Coherency WDL  Content WDL  Delusions None reported or observed  Perception WDL  Hallucination None reported or observed  Judgment Poor  Confusion None  Danger to Self  Current suicidal ideation? Denies  Danger to Others  Danger to Others None reported or observed

## 2021-02-19 NOTE — [facility] Group Notes (Signed)
Adult Psychoeducational Group Not Date:  02/19/2021 Time:  0900-1045 Group Topic/Focus: PROGRESSIVE RELAXATION. A group where deep breathing is taught and tensing and relaxation muscle groups is used. Imagery is used as well.  Pts are asked to imagine 3 pillars that hold them up when they are not able to hold themselves up.  Participation Level:  Active  Participation Quality:  Appropriate  Affect:  Appropriate  Cognitive:  Oriented  Insight: Improving  Engagement in Group:  Engaged  Modes of Intervention:  Activity, Discussion, Education, and Support  Additional Comments:  Pt rates his energy at a 4/10. States what holds him up is his marriage, his family and his faith  Dione Housekeeper
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
## 2021-02-19 NOTE — Progress Notes (Signed)
   02/19/21 0628  Vital Signs  Temp 97.7 F (36.5 C)  Temp Source Oral  Pulse Rate (!) 115  Resp 16  BP 105/82  BP Location Left Arm  BP Method Automatic  Patient Position (if appropriate) Standing   D: Patient denies SI/HI/AVH. Pt. Rated anxiety 6/10 and depression 5/10. Pt. Out in open areas and attended group. A:  Patient took scheduled medicine. Cravings for nicotine were relieved by nicotine gum Support and encouragement provided Routine safety checks conducted every 15 minutes. Patient  Informed to notify staff with any concerns.  R:  Safety maintained.

## 2021-02-19 NOTE — Progress Notes (Signed)
Pt visible on the unit this evening. Pt stated he did not sleep well the night before without the Trazodone. Pt given PRN Trazodone and Vistaril per MAR with HS medication    02/19/21 0000  Psych Admission Type (Psych Patients Only)  Admission Status Involuntary  Psychosocial Assessment  Patient Complaints Anxiety  Eye Contact Fair  Facial Expression Anxious;Pensive  Affect Anxious  Speech Press photographer  Appearance/Hygiene Unremarkable  Behavior Characteristics Cooperative  Mood Anxious  Thought Process  Coherency WDL  Content Preoccupation (preoccupied with medicine)  Delusions None reported or observed  Perception WDL  Hallucination None reported or observed  Judgment Poor  Confusion None  Danger to Self  Current suicidal ideation? Denies  Danger to Others  Danger to Others None reported or observed

## 2021-02-19 NOTE — Progress Notes (Signed)
   02/19/21 2110  COVID-19 Daily Checkoff  Have you had a fever (temp > 37.80C/100F)  in the past 24 hours?  No  If you have had runny nose, nasal congestion, sneezing in the past 24 hours, has it worsened? No  COVID-19 EXPOSURE  Have you traveled outside the state in the past 14 days? No  Have you been in contact with someone with a confirmed diagnosis of COVID-19 or PUI in the past 14 days without wearing appropriate PPE? No  Have you been living in the same home as a person with confirmed diagnosis of COVID-19 or a PUI (household contact)? No  Have you been diagnosed with COVID-19? No

## 2021-02-19 NOTE — Progress Notes (Signed)
   02/19/21 0500  Sleep  Number of Hours 5.75

## 2021-02-20 MED ORDER — LITHIUM CARBONATE 150 MG PO CAPS
450.0000 mg | ORAL_CAPSULE | Freq: Two times a day (BID) | ORAL | Status: DC
  Administered 2021-02-20 – 2021-02-23 (×6): 450 mg via ORAL
  Filled 2021-02-20 (×8): qty 3

## 2021-02-20 MED ORDER — RISPERIDONE 0.5 MG PO TABS
1.0000 mg | ORAL_TABLET | Freq: Every morning | ORAL | Status: DC
  Administered 2021-02-21 – 2021-02-23 (×3): 1 mg via ORAL
  Filled 2021-02-20 (×5): qty 1

## 2021-02-20 MED ORDER — RISPERIDONE 1 MG PO TABS
1.0000 mg | ORAL_TABLET | Freq: Once | ORAL | Status: AC
  Administered 2021-02-20: 1 mg via ORAL
  Filled 2021-02-20: qty 1

## 2021-02-20 MED ORDER — LITHIUM CARBONATE 150 MG PO CAPS
150.0000 mg | ORAL_CAPSULE | Freq: Once | ORAL | Status: AC
  Administered 2021-02-20: 150 mg via ORAL
  Filled 2021-02-20 (×2): qty 1

## 2021-02-20 MED ORDER — RISPERIDONE 1 MG PO TABS
1.5000 mg | ORAL_TABLET | Freq: Every day | ORAL | Status: DC
  Administered 2021-02-20 – 2021-02-22 (×3): 1.5 mg via ORAL
  Filled 2021-02-20 (×4): qty 1

## 2021-02-20 NOTE — Progress Notes (Signed)
Progress note    02/20/21 0807  Psych Admission Type (Psych Patients Only)  Admission Status Involuntary  Psychosocial Assessment  Patient Complaints Anxiety  Eye Contact Fair  Facial Expression Anxious  Affect Appropriate to circumstance  Speech Logical/coherent  Interaction Assertive  Motor Activity Slow  Appearance/Hygiene Unremarkable  Behavior Characteristics Cooperative;Appropriate to situation;Anxious  Mood Anxious;Pleasant  Thought Process  Coherency WDL  Content WDL  Delusions None reported or observed  Perception WDL  Hallucination None reported or observed  Judgment WDL  Confusion None  Danger to Self  Current suicidal ideation? Denies  Danger to Others  Danger to Others None reported or observed

## 2021-02-20 NOTE — Progress Notes (Signed)
[facility] MD Progress Note  02/20/2021 5:29 PM Andrew Kerr  MRN:  [id number]   Subjective: "I am not going down that road of worst-case scenarios."  Objective: Medical record reviewed.  Patient's case discussed in detail with members of the treatment team.  I met with and evaluated the patient the office on the unit today.  He reports that he is better and describes his mood is "pretty good."  Patient initially states that he is no longer going down the road of worst case scenarios with regard to his wife having an inappropriate relationship.  However he perseverates on the fact that she has not answered various "yes no questions" about her text messages or acknowledged to him that she has done anything inappropriate.  He has to be repeatedly redirected to talk about other topics besides his concerns about his wife communicating with another man in a way that patient finds inappropriate.  Patient denies SI, AI, HI, AH or VH.  He denies mood swings.  He does report that he has been having a great time on the unit and that his energy level is higher than normal.  He believes the medications have been helpful and he feels less anxious than he did on admission.  He reports sleeping and eating well.  He appears a bit [place] on exam today than he did last week.  Speech is not as pressured.  He is better able to stay focused when discussing topics without becoming tangential.  He continues to perseverate on concerns about his wife having a relationship with someone else.  The patient slept 6.5 hours last night.  He has been taking medications as prescribed.  The patient has been attending groups and participating appropriately.  Vital signs this morning were BP of 116/81, pulse of 101, O2 sat of 99 and temperature of 97.9.  Vital signs this evening were BP of 130/97 and pulse of 106.  There are no new labs.  Principal Problem: Bipolar I disorder, current or most recent episode manic, severe ([place]) Diagnosis: Principal  Problem:   Bipolar I disorder, current or most recent episode manic, severe ([place]) Active Problems:   MDD (major depressive disorder), recurrent episode, severe ([place])   Anxiety disorder, unspecified  Total Time spent with patient: 25 minutes  Past Psychiatric History: See H&P  Past Medical History:  Past Medical History:  Diagnosis Date  . Anxiety disorder, unspecified 02/17/2021  . Bipolar I disorder, current or most recent episode manic, severe ([place]) 02/17/2021   History reviewed. No pertinent surgical history. Family History: History reviewed. No pertinent family history. Family Psychiatric  History: See H&P Social History:  Social History   Substance and Sexual Activity  Alcohol Use None     Social History   Substance and Sexual Activity  Drug Use Not on file    Social History   Socioeconomic History  . Marital status: Married    Spouse name: Not on file  . Number of children: Not on file  . Years of education: Not on file  . Highest education level: Not on file  Occupational History  . Not on file  Tobacco Use  . Smoking status: Never Smoker  . Smokeless tobacco: Never Used  Substance and Sexual Activity  . Alcohol use: Not on file  . Drug use: Not on file  . Sexual activity: Not on file  Other Topics Concern  . Not on file  Social History Narrative  . Not on file   Social Determinants  of Health   Financial Resource Strain: Not on file  Food Insecurity: Not on file  Transportation Needs: Not on file  Physical Activity: Not on file  Stress: Not on file  Social Connections: Not on file   Additional Social History:    Pain Medications: SEE MAR Prescriptions: SEE MAR Over the Counter: SEE MAR History of alcohol / drug use?: Yes Name of Substance 1: Alcohol 1 - Age of First Use: unknown 1 - Amount (size/oz): unknown 1 - Frequency: unknown 1 - Duration: unknown 1 - Last Use / Amount: 8 yrs ago 1 - Method of Aquiring: unknown 1- Route of Use:  unknown                  Sleep: Good  Appetite:  Good  Current Medications: Current Facility-Administered Medications  Medication Dose Route Frequency Provider Last Rate Last Admin  . acetaminophen (TYLENOL) tablet 650 mg  650 mg Oral Q6H PRN Revonda Humphrey, NP      . alum & mag hydroxide-simeth (MAALOX/MYLANTA) 200-200-20 MG/5ML suspension 30 mL  30 mL Oral Q4H PRN Revonda Humphrey, NP      . hydrOXYzine (ATARAX/VISTARIL) tablet 25 mg  25 mg Oral TID PRN Revonda Humphrey, NP   25 mg at 02/19/21 2106  . lamoTRIgine (LAMICTAL) tablet 200 mg  200 mg Oral BID Revonda Humphrey, NP   200 mg at 02/20/21 0051  . lithium carbonate capsule 450 mg  450 mg Oral BID WC Arthor Captain, MD      . magnesium hydroxide (MILK OF MAGNESIA) suspension 30 mL  30 mL Oral Daily PRN Revonda Humphrey, NP      . nicotine polacrilex (NICORETTE) gum 2 mg  2 mg Oral PRN Lindell Spar I, NP   2 mg at 02/20/21 1501  . propranolol (INDERAL) tablet 40 mg  40 mg Oral BID Revonda Humphrey, NP   40 mg at 02/20/21 0817  . [START ON 02/21/2021] risperiDONE (RISPERDAL) tablet 1 mg  1 mg Oral q morning Arthor Captain, MD      . risperiDONE (RISPERDAL) tablet 1.5 mg  1.5 mg Oral QHS Arthor Captain, MD      . traZODone (DESYREL) tablet 50 mg  50 mg Oral QHS PRN Prescilla Sours, PA-C   50 mg at 02/19/21 2106    Lab Results: No results found for this or any previous visit (from the past 72 hour(s)).  Blood Alcohol level:  No results found for: [facility]  Metabolic Disorder Labs: Lab Results  Component Value Date   HGBA1C 5.3 02/17/2021   MPG 105.41 02/17/2021   No results found for: PROLACTIN Lab Results  Component Value Date   CHOL 202 (H) 02/17/2021   TRIG 101 02/17/2021   HDL 36 (L) 02/17/2021   CHOLHDL 5.6 02/17/2021   VLDL 20 02/17/2021   LDLCALC 146 (H) 02/17/2021    Physical Findings: AIMS: Facial and Oral Movements Muscles of Facial Expression: None, normal Lips and Perioral Area: None,  normal Jaw: None, normal Tongue: None, normal,Extremity Movements Upper (arms, wrists, hands, fingers): None, normal Lower (legs, knees, ankles, toes): None, normal, Trunk Movements Neck, shoulders, hips: None, normal, Overall Severity Severity of abnormal movements (highest score from questions above): None, normal Incapacitation due to abnormal movements: None, normal Patient's awareness of abnormal movements (rate only patient's report): No Awareness, Dental Status Current problems with teeth and/or dentures?: No Does patient usually wear dentures?: No  CIWA:  COWS:     Musculoskeletal: Strength & Muscle Tone: within normal limits Gait & Station: normal Patient leans: N/A  Psychiatric Specialty Exam:  Presentation  General Appearance: Appropriate for Environment; Casual  Eye Contact:Fair  Speech:Clear and Coherent  Speech Volume:Normal  Handedness:Right   Mood and Affect  Mood:Anxious ("really good" but appears anxious)  Affect:Non-Congruent   Thought Process  Thought Processes:Coherent; Other (comment) (Tangential and at times perseverative on marital issues)  Descriptions of Associations:Tangential  Orientation:Full (Time, Place and Person)  Thought Content:Illogical; Paranoid Ideation; Perseveration; Tangential  History of Schizophrenia/Schizoaffective disorder:No  Duration of Psychotic Symptoms:Less than six months  Hallucinations:Hallucinations: None  Ideas of Reference:None  Suicidal Thoughts:Suicidal Thoughts: No  Homicidal Thoughts:Homicidal Thoughts: No   Sensorium  Memory:Immediate Fair; Recent Fair; Remote Fair  Judgment:Impaired  Insight:Shallow   Executive Functions  Concentration:Fair  Attention Span:Fair  Alpha  Language:Good   Psychomotor Activity  Psychomotor Activity:Psychomotor Activity: Normal   Assets  Assets:Desire for Improvement; Financial Resources/Insurance; Housing;  Physical Health; Resilience; Social Support   Sleep  Sleep:Sleep: Good Number of Hours of Sleep: 6.5    Physical Exam: Physical Exam Vitals and nursing note reviewed.  HENT:     Head: Normocephalic and atraumatic.  Neurological:     General: No focal deficit present.     Mental Status: He is alert and oriented to person, place, and time.    Review of Systems  Constitutional: Negative for chills, diaphoresis and fever.  HENT: Negative for hearing loss and sore throat.   Eyes: Negative for blurred vision.  Respiratory: Negative for cough and shortness of breath.   Cardiovascular: Negative for chest pain and palpitations.  Gastrointestinal: Negative for constipation, diarrhea, nausea and vomiting.  Genitourinary: Negative for dysuria.  Musculoskeletal: Negative.   Skin: Negative.   Neurological: Negative for dizziness, tremors and headaches.  Psychiatric/Behavioral: Negative for depression, hallucinations and suicidal ideas. The patient is nervous/anxious. The patient does not have insomnia.    Blood pressure (!) 130/97, pulse (!) 106, temperature 97.9 F (36.6 C), temperature source Oral, resp. rate 16, height 6' (1.829 m), weight 91.2 kg, SpO2 100 %. Body mass index is 27.26 kg/m.   Treatment Plan Summary: Daily contact with patient to assess and evaluate symptoms and progress in treatment and Medication management   Continue every 15-minute observation status  Encouraged participation in group therapy and therapeutic milieu  Bipolar disorder, current episode manic with psychotic features -Increase lithium carbonate to 450 mg twice daily for mood stabilization -Continue Lamictal 200 mg twice daily for mood stabilization -Increase risperidone to 1 mg every morning and 1.5 mg nightly for psychotic symptoms and mood stabilization  Anxiety -Continue hydroxyzine 25 mg 3 times daily PRN anxiety  Insomnia -Continue trazodone 50 mg at bedtime PRN  Migraine headaches   -Continue propranolol/Inderal 40 mg twice daily  Social work is working on disposition planning and aftercare plans   Arthor Captain, MD 02/20/2021, 5:29 PM

## 2021-02-20 NOTE — Plan of Care (Signed)
  Problem: Activity: Goal: Interest or engagement in leisure activities will improve Outcome: Progressing Goal: Imbalance in normal sleep/wake cycle will improve Outcome: Progressing   Problem: Coping: Goal: Coping ability will improve Outcome: Progressing   

## 2021-02-20 NOTE — Progress Notes (Signed)
Pt stated he was feeling better. Pt visible on the unit this evening    02/20/21 2000  Psychosocial Assessment  Patient Complaints Anxiety  Eye Contact Fair  Facial Expression Anxious  Affect Appropriate to circumstance  Speech Logical/coherent  Interaction Assertive  Motor Activity Slow  Appearance/Hygiene Unremarkable  Behavior Characteristics Cooperative;Anxious  Mood Anxious  Thought Process  Coherency WDL  Content WDL  Delusions None reported or observed  Perception WDL  Hallucination None reported or observed  Judgment WDL  Confusion None  Danger to Self  Current suicidal ideation? Denies  Danger to Others  Danger to Others None reported or observed

## 2021-02-20 NOTE — B[facility] Group Notes (Signed)
LCSW Group Therapy Note  02/20/2021   Type of Therapy and Topic:  Group Therapy - Healthy vs Unhealthy Coping Skills  Participation Level:  Active  Description of Group The focus of this group was to determine what unhealthy coping techniques typically are used by group members and what healthy coping techniques would be helpful in coping with various problems. Patients were guided in becoming aware of the differences between healthy and unhealthy coping techniques. Patients were asked to identify 2-3 healthy coping skills they would like to learn to use more effectively.  Therapeutic Goals 1. Patients learned that coping is what human beings do all day long to deal with various situations in their lives 2. Patients defined and discussed healthy vs unhealthy coping techniques 3. Patients identified their preferred coping techniques and identified whether these were healthy or unhealthy 4. Patients determined 2-3 healthy coping skills they would like to become more familiar with and use more often. 5. 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 Patient proved open to input from peers and feedback from CSW. Patient demonstrated insight into the subject matter, was respectful of peers, and participated throughout the entire session.   Therapeutic Modalities Cognitive Behavioral Therapy Motivational Interviewing  Chrys Racer 02/20/2021  3:38 PM

## 2021-02-20 NOTE — B[facility] Group Notes (Signed)
Occupational Therapy Group Note Date: 02/20/2021 Group Topic/Focus: Brain Fitness  Group Description: Group encouraged increased social engagement and participation through discussion/activity focused on brain fitness. Patients were provided education on various brain fitness activities/strategies, with explanation provided on the qualifying factors including: one, that is has to be challenging/hard and two, it has to be something that you do not do every day. Patients engaged actively during group session in various brain fitness activities to increase attention, concentration, and problem-solving skills. Discussion followed with a focus on identifying the benefits of brain fitness activities as use for adaptive coping strategies and distraction.    Therapeutic Goal(s): Identify benefit(s) of brain fitness activities as use for adaptive coping and healthy distraction. Identify specific brain fitness activities to engage in as use for adaptive coping and healthy distraction. Participation Level: Active   Participation Quality: Independent   Behavior: Calm and Cooperative   Speech/Thought Process: Focused   Affect/Mood: Euthymic   Insight: Fair   Judgement: Fair   Individualization: Andrew Kerr was active in their participation of group discussion/activity. Pt appeared receptive to information/education received on brain fitness and identified benefit of use as coping skill/distraction.   Modes of Intervention: Activity, Discussion, Education and Problem-solving  Patient Response to Interventions:  Attentive, Engaged, Receptive and Interested   Plan: Continue to engage patient in OT groups 2 - 3x/week.  02/20/2021  Donne Hazel, MOT, OTR/L

## 2021-02-20 NOTE — Progress Notes (Signed)
Patient rated his day as a 5 out of 10 initially, but ended the day as a 7 out of 10. He had a good talk with his doctor and had his medication increased as well. His goal for tomorrow is to "talk better" when speaking with his doctor.

## 2021-02-20 NOTE — Progress Notes (Signed)
Recreation Therapy Notes  Date:  5.2.22 Time: 0947 Location: [address] Dayroom  Group Topic: Stress Management  Goal Area(s) Addresses:  Patient will identify positive stress management techniques. Patient will identify benefits of using stress management post d/c.  Intervention: Stress Management  Activity: Meditation.  LRT played meditation that focused on being able to free yourself by not holding on to things you can't change.  Patients were to listen and follow as meditation played to engage in activity.   Education:  Stress Management, Discharge Planning.   Education Outcome: Acknowledges Education  Clinical Observations/Feedback: Pt did not attend group session.    Harlym Gehling, LRT/CTRS         Zohar Laing A 02/20/2021 11:05 AM 
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
## 2021-02-21 NOTE — Plan of Care (Signed)
Nurse discussed coping skills with patient.  
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
## 2021-02-21 NOTE — BHH Group Notes (Signed)
ADULT GRIEF GROUP NOTE:    Spiritual care group on grief and loss facilitated by chaplain Dyanne Carrel, [facility]    Group Goal:    Support / Education around grief and loss   Members engage in facilitated group support and psycho-social education.    Group Description:    Following introductions and group rules, group members engaged in facilitated group dialog and support around topic of loss, with particular support around experiences of loss in their lives. Group Identified types of loss (relationships / self / things) and identified patterns, circumstances, and changes that precipitate losses. Reflected on thoughts / feelings around loss, normalized grief responses, and recognized variety in grief experience.   Group noted Worden's four tasks of grief in discussion.    Group drew on Adlerian / Rogerian, narrative, MI,   Patient Progress:  Andrew Kerr attended and participated in group.  He was actively engaged in the conversation and shared about some of his feelings related to grief.  He also showed signs of active and compassionate listening as others shared.  Chaplain Dyanne Carrel, [facility] Pager, [phone] 2:19 PM

## 2021-02-21 NOTE — Progress Notes (Cosign Needed)
Adult Psychoeducational Group Note  Date:  02/21/2021 Time:  9:57 AM  Group Topic/Focus:  Goals Group:   The focus of this group is to help patients establish daily goals to achieve during treatment and discuss how the patient can incorporate goal setting into their daily lives to aide in recovery.  Participation Level:  Active  Participation Quality:  Appropriate  Affect:  Appropriate  Cognitive:  Alert  Insight: Appropriate  Engagement in Group:  Engaged  Modes of Intervention:  Discussion  Additional Comments:  Pt attended and participated in goals group today.  Shanen Norris R Aarya Quebedeaux 02/21/2021, 9:57 AM

## 2021-02-21 NOTE — Progress Notes (Signed)
D:  Patient's self inventory sheet, patient sleeps good, sleep medication helpful.  Good appetite, normal energy level, good concentration.  Rated depression, hopeless and anxiety 7.  Denied withdrawals.  Denied SI.  Denied physical problems.  Denied physical pain.  Goal is work to go home.  Plans to truthfully communicate with MD.  No discharge plans.   A:  Medications administered per MD orders.  Emotional support and encouragement given patient. R:  Denied SI and HI, contracts for safety.  Denied A/V hallucinations.  Safety maintained with 15 minute checks.

## 2021-02-21 NOTE — Progress Notes (Addendum)
[facility] MD Progress Note  02/21/2021 4:00 PM Andrew Kerr  MRN:  [id number]   Subjective: "I am frustrated that I cannot get yes or no answers to my questions from my wife about her texts.  She won't talk to me about it."  Objective: Medical record reviewed.  Patient's case discussed in detail with members of the treatment team.  I met with and evaluated the patient the office on the unit today.  Patient reports that he has been sleeping well and his appetite is good.  He describes his mood as good.  He does report feeling frustrated that his wife will not answer questions that he has about her text communications to a male peer at work.  He maintains that the texts are inappropriate and remains suspicious about them and that they indicate she is having an inappropriate relationship outside of the marriage.  Patient states that he engages in worst case scenario thinking about their relationship when he starts thinking about the texts. He reports that he has called wife multiple times from the hospital and the conversations go well until patient tries to discussed the text messages with her.  He denies passive wish for death, SI, AI, HI, PI, AH or VH.  He reports feeling calmer on the medications.  He denies any side effects from his medications.  He denies any physical problems.  During my interaction with patient he is appropriately groomed.  There are no motor abnormalities.  Speech is coherent and of normal rate but verbose.  Mood is "good" but anxious about relationship with wife.  Affect has full range and is appropriately reactive.  Thought processes are coherent and generally goal-directed but there is perseveration on concern that wife is having an inappropriate relationship.  Patient requires frequent redirection to disengage from this topic.  There is no overt delusional thought content.  Patient denies perceptual abnormalities and does not appear to respond or attend internal stimuli.  He is alert and  oriented.  Staff report that patient is eating and sleeping adequately.  Vital signs this morning include BP of 131/84 sitting and 122/84 standing, pulse of 75 sitting and 89 standing, O2 sat of 99%, respirations of 16 and temperature of 97.7.  Patient is taking standing dose medications as prescribed.  He took PRN trazodone for sleep and PRN hydroxyzine for anxiety last night at bedtime but has taken no other PRN medications.  I made direct phone contact today with patient's wife Fabio Wah (pt has signed consent to allow team to speak with his wife) and we spoke for at least 30 minutes.  She provided collateral history. We discussed patient's symptoms, diagnoses, treatments, medications, need for therapy, anticipated outcomes of treatment, plans for outpatient treatment, etc.  Spouse was provided opportunity to ask questions and spouse's questions were answered.  Jolayne Panther stated understanding of information discussed and expressed her appreciation for the call.  Principal Problem: Bipolar I disorder, current or most recent episode manic, severe ([place]) Diagnosis: Principal Problem:   Bipolar I disorder, current or most recent episode manic, severe ([place]) Active Problems:   MDD (major depressive disorder), recurrent episode, severe ([place])   Anxiety disorder, unspecified  Total Time spent with patient: 20 minutes   Past Psychiatric History: See H&P  Past Medical History:  Past Medical History:  Diagnosis Date  . Anxiety disorder, unspecified 02/17/2021  . Bipolar I disorder, current or most recent episode manic, severe ([place]) 02/17/2021   History reviewed. No pertinent surgical history. Family  History: History reviewed. No pertinent family history. Family Psychiatric  History: See H&P Social History:  Social History   Substance and Sexual Activity  Alcohol Use None     Social History   Substance and Sexual Activity  Drug Use Not on file    Social History   Socioeconomic History  .  Marital status: Married    Spouse name: Not on file  . Number of children: Not on file  . Years of education: Not on file  . Highest education level: Not on file  Occupational History  . Not on file  Tobacco Use  . Smoking status: Never Smoker  . Smokeless tobacco: Never Used  Substance and Sexual Activity  . Alcohol use: Not on file  . Drug use: Not on file  . Sexual activity: Not on file  Other Topics Concern  . Not on file  Social History Narrative  . Not on file   Social Determinants of Health   Financial Resource Strain: Not on file  Food Insecurity: Not on file  Transportation Needs: Not on file  Physical Activity: Not on file  Stress: Not on file  Social Connections: Not on file   Additional Social History:    Pain Medications: SEE MAR Prescriptions: SEE MAR Over the Counter: SEE MAR History of alcohol / drug use?: Yes Name of Substance 1: Alcohol 1 - Age of First Use: unknown 1 - Amount (size/oz): unknown 1 - Frequency: unknown 1 - Duration: unknown 1 - Last Use / Amount: 8 yrs ago 1 - Method of Aquiring: unknown 1- Route of Use: unknown                  Sleep: Good  Appetite:  Good  Current Medications: Current Facility-Administered Medications  Medication Dose Route Frequency Provider Last Rate Last Admin  . acetaminophen (TYLENOL) tablet 650 mg  650 mg Oral Q6H PRN Revonda Humphrey, NP      . alum & mag hydroxide-simeth (MAALOX/MYLANTA) 200-200-20 MG/5ML suspension 30 mL  30 mL Oral Q4H PRN Revonda Humphrey, NP      . hydrOXYzine (ATARAX/VISTARIL) tablet 25 mg  25 mg Oral TID PRN Revonda Humphrey, NP   25 mg at 02/20/21 2137  . lamoTRIgine (LAMICTAL) tablet 200 mg  200 mg Oral BID Revonda Humphrey, NP   200 mg at 02/21/21 0806  . lithium carbonate capsule 450 mg  450 mg Oral BID WC Arthor Captain, MD   450 mg at 02/21/21 0806  . magnesium hydroxide (MILK OF MAGNESIA) suspension 30 mL  30 mL Oral Daily PRN Revonda Humphrey, NP       . nicotine polacrilex (NICORETTE) gum 2 mg  2 mg Oral PRN Lindell Spar I, NP   2 mg at 02/21/21 1549  . propranolol (INDERAL) tablet 40 mg  40 mg Oral BID Revonda Humphrey, NP   40 mg at 02/21/21 4825  . risperiDONE (RISPERDAL) tablet 1 mg  1 mg Oral q morning Arthor Captain, MD   1 mg at 02/21/21 0037  . risperiDONE (RISPERDAL) tablet 1.5 mg  1.5 mg Oral QHS Arthor Captain, MD   1.5 mg at 02/20/21 2137  . traZODone (DESYREL) tablet 50 mg  50 mg Oral QHS PRN Prescilla Sours, PA-C   50 mg at 02/20/21 2137    Lab Results: No results found for this or any previous visit (from the past 48 hour(s)).  Blood Alcohol level:  No  results found for: [facility]  Metabolic Disorder Labs: Lab Results  Component Value Date   HGBA1C 5.3 02/17/2021   MPG 105.41 02/17/2021   No results found for: PROLACTIN Lab Results  Component Value Date   CHOL 202 (H) 02/17/2021   TRIG 101 02/17/2021   HDL 36 (L) 02/17/2021   CHOLHDL 5.6 02/17/2021   VLDL 20 02/17/2021   LDLCALC 146 (H) 02/17/2021    Physical Findings: AIMS: Facial and Oral Movements Muscles of Facial Expression: None, normal Lips and Perioral Area: None, normal Jaw: None, normal Tongue: None, normal,Extremity Movements Upper (arms, wrists, hands, fingers): None, normal Lower (legs, knees, ankles, toes): None, normal, Trunk Movements Neck, shoulders, hips: None, normal, Overall Severity Severity of abnormal movements (highest score from questions above): None, normal Incapacitation due to abnormal movements: None, normal Patient's awareness of abnormal movements (rate only patient's report): No Awareness, Dental Status Current problems with teeth and/or dentures?: No Does patient usually wear dentures?: No  CIWA:    COWS:     Musculoskeletal: Strength & Muscle Tone: within normal limits Gait & Station: normal Patient leans: N/A  Psychiatric Specialty Exam:  Presentation  General Appearance: Appropriate for Environment; Casual;  Well Groomed  Eye Contact:Good  Speech:Clear and Coherent; Normal Rate; Other (comment) (Verbose but not pressured)  Speech Volume:Normal  Handedness:Right   Mood and Affect  Mood:Anxious  Affect:Congruent   Thought Process  Thought Processes:Coherent; Other (comment) (Intermittently tangential.  Perseverative on marital issues.)  Descriptions of Associations:Tangential  Orientation:Full (Time, Place and Person)  Thought Content:Perseveration; Tangential (Perseverative on marital issues)  History of Schizophrenia/Schizoaffective disorder:No  Duration of Psychotic Symptoms:Less than six months  Hallucinations:Hallucinations: None  Ideas of Reference:None  Suicidal Thoughts:Suicidal Thoughts: No  Homicidal Thoughts:Homicidal Thoughts: No   Sensorium  Memory:Immediate Good; Remote Fair; Recent Fair  Judgment:Other (comment) (Limited)  Insight:Shallow   Executive Functions  Concentration:Fair  Attention Span:Fair  [place]  Language:Good   Psychomotor Activity  Psychomotor Activity:Psychomotor Activity: Normal   Assets  Assets:Desire for Improvement; Financial Resources/Insurance; Housing; Physical Health; Resilience; Social Support   Sleep  Sleep:Sleep: Good Number of Hours of Sleep: 6.5    Physical Exam: Physical Exam Vitals and nursing note reviewed.  HENT:     Head: Normocephalic and atraumatic.  Neurological:     General: No focal deficit present.     Mental Status: He is alert and oriented to person, place, and time.    Review of Systems  Constitutional: Negative for chills, diaphoresis and fever.  HENT: Negative for hearing loss and sore throat.   Eyes: Negative for blurred vision.  Respiratory: Negative for cough and shortness of breath.   Cardiovascular: Negative for chest pain and palpitations.  Gastrointestinal: Negative for constipation, diarrhea, nausea and vomiting.  Genitourinary: Negative  for dysuria.  Musculoskeletal: Negative.   Skin: Negative.   Neurological: Negative for dizziness, tremors and headaches.  Psychiatric/Behavioral: Negative for depression, hallucinations and suicidal ideas. The patient is nervous/anxious. The patient does not have insomnia.    Blood pressure 122/84, pulse 89, temperature 97.7 F (36.5 C), temperature source Oral, resp. rate 16, height 6' (1.829 m), weight 91.2 kg, SpO2 99 %. Body mass index is 27.26 kg/m.   Treatment Plan Summary: 45 year old male with history of bipolar disorder, GAD and OCD-like symptoms admitted for symptoms of intermittent anger outbursts, mood lability, agitation, irrational thinking, poor sleep, decreased appetite, intrusiveness and paranoid concerns that his wife is cheating on him.  Mood symptoms have improved with  increase in risperidone and initiation of lithium carbonate.  Patient continues to perseverate on concerns about wife being unfaithful.  This concern may be obsessive rather than delusional in nature given his history of OCD-like symptoms.  Daily contact with patient to assess and evaluate symptoms and progress in treatment and Medication management   Continue every 15-minute observation status  Encouraged participation in group therapy and therapeutic milieu  Bipolar disorder, MRE episode manic with possible psychotic features -Continue lithium carbonate 450 mg twice daily for mood stabilization -Continue Lamictal 200 mg twice daily for mood stabilization -Continue risperidone 1 mg every morning and 1.5 mg nightly for mood stabilization and to target possible psychotic symptoms  Anxiety -Continue hydroxyzine 25 mg 3 times daily PRN anxiety  Insomnia -Continue trazodone 50 mg at bedtime PRN  Migraine headaches  -Continue propranolol/Inderal 40 mg twice daily  Social work is working on disposition planning and aftercare plans   Arthor Captain, MD 02/21/2021, 4:00 PM

## 2021-02-21 NOTE — Progress Notes (Signed)
Recreation Therapy Notes  Animal-Assisted Activity (AAA) Program Checklist/Progress Notes Patient Eligibility Criteria Checklist & Daily Group note for Rec Tx Intervention  Date: 5.3.22 Time: 1430 Location: 300 Hall Dayroom   AAA/T Program Assumption of Risk Form signed by Patient/ or Parent Legal Guardian  YES  Patient is free of allergies or severe asthma  YES   Patient reports no fear of animals  YES  Patient reports no history of cruelty to animals YES   Patient understands his/her participation is voluntary  YES   Patient washes hands before animal contact  YES   Patient washes hands after animal contact  YES  Education: Hand Washing, Appropriate Animal Interaction   Education Outcome: Acknowledges understanding/In group clarification offered/Needs additional education.   Clinical Observations/Feedback: Pt did not attend group session.    Andrew Kerr, LRT/CTRS         Andrew Kerr A 02/21/2021 3:50 PM 

## 2021-02-21 NOTE — Progress Notes (Signed)
Adult Psychoeducational Group Note  Date:  02/21/2021 Time:  8:41 PM  Group Topic/Focus:  Wrap-Up Group:   The focus of this group is to help patients review their daily goal of treatment and discuss progress on daily workbooks.  Participation Level:  Minimal  Participation Quality:  Appropriate  Affect:  Appropriate  Cognitive:  Oriented  Insight: Appropriate  Engagement in Group:  Engaged  Modes of Intervention:  Education  Additional Comments:  Patient attended and participated in group tonight. He reports that the most significant thing for him today was talking to the doctor to see where he is. He talked to the Child psychotherapist about the flow of things.  Lita Mains [facility] 02/21/2021, 8:41 PM

## 2021-02-21 NOTE — Progress Notes (Signed)
Pt stated he was doing better and visible on the unit this evening. Pt given PRN Trazodone and Vistaril per MAR with HS medication    02/21/21 2200  Psych Admission Type (Psych Patients Only)  Admission Status Involuntary  Psychosocial Assessment  Patient Complaints Anxiety  Eye Contact Fair  Facial Expression Anxious  Affect Appropriate to circumstance  Speech Logical/coherent  Interaction Assertive  Motor Activity Slow  Appearance/Hygiene Unremarkable  Behavior Characteristics Anxious  Mood Anxious  Thought Process  Coherency WDL  Content WDL  Delusions None reported or observed  Perception WDL  Hallucination None reported or observed  Judgment WDL  Confusion None  Danger to Self  Current suicidal ideation? Denies  Danger to Others  Danger to Others None reported or observed

## 2021-02-22 ENCOUNTER — Encounter (HOSPITAL_COMMUNITY): Payer: Self-pay | Admitting: Psychiatry

## 2021-02-22 DIAGNOSIS — F429 Obsessive-compulsive disorder, unspecified: Secondary | ICD-10-CM

## 2021-02-22 HISTORY — DX: Obsessive-compulsive disorder, unspecified: F42.9

## 2021-02-22 NOTE — Tx Team (Signed)
Interdisciplinary Treatment and Diagnostic Plan Update  02/22/2021 Time of Session:  [employer] MRN: [id number]  Principal Diagnosis: Bipolar I disorder, current or most recent episode manic, severe (H[facility])  Secondary Diagnoses: Principal Problem:   Bipolar I disorder, current or most recent episode manic, severe (H[facility]) Active Problems:   MDD (major depressive disorder), recurrent episode, severe (H[facility])   Anxiety disorder, unspecified   Current Medications:  Current Facility-Administered Medications  Medication Dose Route Frequency Provider Last Rate Last Admin  . acetaminophen (TYLENOL) tablet 650 mg  650 mg Oral Q6H PRN Ardis Hughs, NP      . alum & mag hydroxide-simeth (MAALOX/MYLANTA) 200-200-20 MG/5ML suspension 30 mL  30 mL Oral Q4H PRN Ardis Hughs, NP      . hydrOXYzine (ATARAX/VISTARIL) tablet 25 mg  25 mg Oral TID PRN Ardis Hughs, NP   25 mg at 02/21/21 2144  . lamoTRIgine (LAMICTAL) tablet 200 mg  200 mg Oral BID Ardis Hughs, NP   200 mg at 02/22/21 0805  . lithium carbonate capsule 450 mg  450 mg Oral BID WC Claudie Revering, MD   450 mg at 02/22/21 0806  . magnesium hydroxide (MILK OF MAGNESIA) suspension 30 mL  30 mL Oral Daily PRN Ardis Hughs, NP      . nicotine polacrilex (NICORETTE) gum 2 mg  2 mg Oral PRN Armandina Stammer I, NP   2 mg at 02/22/21 1015  . propranolol (INDERAL) tablet 40 mg  40 mg Oral BID Ardis Hughs, NP   40 mg at 02/22/21 0805  . risperiDONE (RISPERDAL) tablet 1 mg  1 mg Oral q morning Claudie Revering, MD   1 mg at 02/22/21 3568  . risperiDONE (RISPERDAL) tablet 1.5 mg  1.5 mg Oral QHS Claudie Revering, MD   1.5 mg at 02/21/21 2144  . traZODone (DESYREL) tablet 50 mg  50 mg Oral QHS PRN Jaclyn Shaggy, PA-C   50 mg at 02/21/21 2143   PTA Medications: Medications Prior to Admission  Medication Sig Dispense Refill Last Dose  . lamoTRIgine (LAMICTAL) 200 MG tablet Take 200 mg by mouth 2 (two) times daily.     .  propranolol (INDERAL) 20 MG tablet Take 40 mg by mouth 2 (two) times daily.     . risperiDONE (RISPERDAL) 1 MG tablet Take 0.5 mg by mouth in the morning and at bedtime. Take one tablet every morning and half a tablet at bedtime.     Marland Kitchen VYVANSE 50 MG CHEW Chew 25 mg by mouth 2 (two) times daily at 8am and 2pm.       Patient Stressors: Health problems Marital or family conflict  Patient Strengths: Ability for insight Barrister's clerk for treatment/growth Supportive family/friends  Treatment Modalities: Medication Management, Group therapy, Case management,  1 to 1 session with clinician, Psychoeducation, Recreational therapy.   Physician Treatment Plan for Primary Diagnosis: Bipolar I disorder, current or most recent episode manic, severe (HCC) Long Term Goal(s): Improvement in symptoms so as ready for discharge   Short Term Goals: Ability to identify changes in lifestyle to reduce recurrence of condition will improve Ability to verbalize feelings will improve Ability to demonstrate self-control will improve Ability to identify and develop effective coping behaviors will improve Ability to identify triggers associated with substance abuse/mental health issues will improve  Medication Management: Evaluate patient's response, side effects, and tolerance of medication regimen.  Therapeutic Interventions: 1 to 1 sessions, Unit Group sessions and  Medication administration.  Evaluation of Outcomes: Progressing  Physician Treatment Plan for Secondary Diagnosis: Principal Problem:   Bipolar I disorder, current or most recent episode manic, severe (HCC) Active Problems:   MDD (major depressive disorder), recurrent episode, severe (HCC)   Anxiety disorder, unspecified  Long Term Goal(s): Improvement in symptoms so as ready for discharge   Short Term Goals: Ability to identify changes in lifestyle to reduce recurrence of condition will improve Ability to verbalize feelings  will improve Ability to demonstrate self-control will improve Ability to identify and develop effective coping behaviors will improve Ability to identify triggers associated with substance abuse/mental health issues will improve     Medication Management: Evaluate patient's response, side effects, and tolerance of medication regimen.  Therapeutic Interventions: 1 to 1 sessions, Unit Group sessions and Medication administration.  Evaluation of Outcomes: Progressing   RN Treatment Plan for Primary Diagnosis: Bipolar I disorder, current or most recent episode manic, severe (HCC) Long Term Goal(s): Knowledge of disease and therapeutic regimen to maintain health will improve  Short Term Goals: Ability to participate in decision making will improve, Ability to verbalize feelings will improve and Ability to identify and develop effective coping behaviors will improve  Medication Management: RN will administer medications as ordered by provider, will assess and evaluate patient's response and provide education to patient for prescribed medication. RN will report any adverse and/or side effects to prescribing provider.  Therapeutic Interventions: 1 on 1 counseling sessions, Psychoeducation, Medication administration, Evaluate responses to treatment, Monitor vital signs and CBGs as ordered, Perform/monitor CIWA, COWS, AIMS and Fall Risk screenings as ordered, Perform wound care treatments as ordered.  Evaluation of Outcomes: Progressing   LCSW Treatment Plan for Primary Diagnosis: Bipolar I disorder, current or most recent episode manic, severe (HCC) Long Term Goal(s): Safe transition to appropriate next level of care at discharge, Engage patient in therapeutic group addressing interpersonal concerns.  Short Term Goals: Engage patient in aftercare planning with referrals and resources, Increase social support and Increase emotional regulation  Therapeutic Interventions: Assess for all discharge  needs, 1 to 1 time with Social worker, Explore available resources and support systems, Assess for adequacy in community support network, Educate family and significant other(s) on suicide prevention, Complete Psychosocial Assessment, Interpersonal group therapy.  Evaluation of Outcomes: Progressing   Progress in Treatment: Attending groups: Yes. Participating in groups: Yes. Taking medication as prescribed: Yes. Toleration medication: Yes. Family/Significant other contact made: Yes, individual(s) contacted:  wife Patient understands diagnosis: No. Discussing patient identified problems/goals with staff: Yes. Medical problems stabilized or resolved: Yes. Denies suicidal/homicidal ideation: Yes. Issues/concerns per patient self-inventory: No. Other: None  New problem(s) identified: No, Describe:  None  New Short Term/Long Term Goal(s):medication stabilization, elimination of SI thoughts, development of comprehensive mental wellness plan.  Patient Goals:  "to go home"  Discharge Plan or Barriers: Patient recently admitted. CSW will continue to follow and assess for appropriate referrals and possible discharge planning.  Reason for Continuation of Hospitalization: Aggression Mania Medication stabilization  Estimated Length of Stay: 3-5 days  Attendees: Patient: Andrew Kerr 02/17/2021   Physician:  02/17/2021   Nursing:  02/17/2021   RN Care Manager: 02/17/2021   Social Worker: Fredirick Lathe, Theresia Majors 02/17/2021   Recreational Therapist:  02/17/2021   Other:  02/17/2021   Other:  02/17/2021   Other: 02/17/2021       Scribe for Treatment Team: Felizardo Hoffmann, Theresia Majors 02/22/2021 11:39 AM

## 2021-02-22 NOTE — [facility] Group Notes (Signed)
LCSW Group Therapy Note 02/22/2021 1:15pm  Type of Therapy and Topic:  Group Therapy:  Setting Goals  Participation Level:  Did Not Attend  Description of Group: In this process group, patients discussed using strengths to work toward goals and address challenges.  Patients identified two positive things about themselves and one goal they were working on.  Patients were given the opportunity to share openly and support each other's plan for self-empowerment.  The group discussed the value of gratitude and were encouraged to have a daily reflection of positive characteristics or circumstances.  Patients were encouraged to identify a plan to utilize their strengths to work on current challenges and goals.  Therapeutic Goals 1. Patient will verbalize personal strengths/positive qualities and relate how these can assist with achieving desired personal goals 2. Patients will verbalize affirmation of peers plans for personal change and goal setting 3. Patients will explore the value of gratitude and positive focus as related to successful achievement of goals 4. Patients will verbalize a plan for regular reinforcement of personal positive qualities and circumstances.  Therapeutic Modalities Cognitive Behavioral Therapy Motivational Interviewing    Chrys Racer 02/22/2021 2:58 PM

## 2021-02-22 NOTE — Progress Notes (Signed)
   02/22/21 0500  Sleep  Number of Hours 6.75

## 2021-02-22 NOTE — Progress Notes (Signed)
Recreation Therapy Notes  Date: 5.4.22 Time: 0930 Location: 300 [address] Dayroom  Group Topic: Stress Management   Goal Area(s) Addresses:  Patient will actively participate in stress management techniques presented during session.  Patient will successfully identify benefit of practicing stress management post d/c.   Behavioral Response: Engaged, Appropriate  Intervention: Guided exercise with ambient sound and script  Activity :Guided Imagery  LRT provided education, instruction, and demonstration on practice of visualization via guided imagery. Patient was asked to participate in the technique introduced during session.   Education:  Stress Management, Discharge Planning.   Education Outcome: Acknowledges education  Clinical Observations/Feedback: Patient actively engaged in technique introduced, expressed no concerns.    Caroll Rancher, LRT/CTRS         Caroll Rancher A 02/22/2021 11:27 AM

## 2021-02-22 NOTE — [facility] Group Notes (Signed)
Adult Psychoeducational Group Note  Date:  02/22/2021 Time:  9:58 AM  Group Topic/Focus:  Goals Group:   The focus of this group is to help patients establish daily goals to achieve during treatment and discuss how the patient can incorporate goal setting into their daily lives to aide in recovery.  Participation Level:  Active  Participation Quality:  Appropriate  Affect:  Appropriate  Cognitive:  Appropriate  Insight: Appropriate  Engagement in Group:  Engaged  Modes of Intervention:  Discussion  Additional Comments:  Patient attended morning goals setting group and said that his goal for today is to discuss his discharge plan. We also discussed self-care.   Veleta Yamamoto W Shewanda Sharpe 02/22/2021, 9:58 AM

## 2021-02-22 NOTE — Progress Notes (Signed)
   02/22/21 0609  Vital Signs  Temp 97.6 F (36.4 C)  Temp Source Oral  Pulse Rate 76  BP 128/82  BP Location Left Arm  BP Method Automatic  Patient Position (if appropriate) Sitting  Oxygen Therapy  SpO2 100 %   D: Patient denies SI/HI/AVH. Patient denies anxiety,but rated depression 1/10. Pt. Out in open areas.  A:  Patient took scheduled medicine.  Support and encouragement provided Routine safety checks conducted every 15 minutes. Patient  Informed to notify staff with any concerns.  R:  Safety maintained.

## 2021-02-22 NOTE — Progress Notes (Signed)
[facility] MD Progress Note  02/22/2021 4:58 PM Andrew Kerr  MRN:  [id number]   Subjective: "I'm doing good.  I am hoping to go home soon.  I am not having racing thoughts and I am not having thoughts about marriage problems."  Objective: Medical record reviewed.  Patient's case discussed in detail with members of the treatment team.  I met with and evaluated the patient the office on the unit today.  Patient reports feeling less anxious, [place] on medications.  He reports he is no longer having as many perseverative thoughts about his marriage issues or concern that his wife may be having an inappropriate relationship outside of the marriage.  He denies depressed mood, wish for death, SI, AI, HI, racing thoughts, AH or VH.  He reports that he is sleeping well and eating well.  Patient states that he been seeing his current therapist for months and feels it is helpful.  He states that the therapist gives him homework but he does not always do it.  We talked about the importance of patient engaging in CBT to address OCD-like symptoms after discharge.  I explained to him that compulsive seeking of reassurance when he has intrusive obsessive thoughts that his wife may be having an affair will only increase his urged to seek more reassurance from her again in the future and will not alleviate his problem.  He stated understanding.  During our conversation he maintains good eye contact.  There are no motor abnormalities.  Speech is of normal rate and volume.  He reports euthymic mood.  Affect is stable, congruent.  He appears calmer.  Thought processes are coherent and goal-directed.  There is less perseveration on obsessive thoughts about wife having an affair.  He is better able to disengage from this topic when prompted.  He denies perceptual abnormalities and does not appear to respond to internal stimuli.  He is alert and oriented.  Attention and concentration are fair.  Insight and judgment are fair.  The patient  slept 6.75 hours last night.  Vital signs this afternoon include BP of 110/80 and pulse of 100.  Temperature was 97.6 today and O2 sat was 100%.  He is taking standing dose medications as prescribed.  The patient took as needed trazodone last night for sleep and as needed hydroxyzine last night for anxiety.  He has been attending and participating in groups appropriately.  Principal Problem: Bipolar I disorder, current or most recent episode manic, severe ([place]) Diagnosis: Principal Problem:   Bipolar I disorder, current or most recent episode manic, severe ([place]) Active Problems:   MDD (major depressive disorder), recurrent episode, severe ([place])   Anxiety disorder, unspecified   OCD (obsessive compulsive disorder)  Total Time spent with patient: 25 minutes   Past Psychiatric History: See H&P  Past Medical History:  Past Medical History:  Diagnosis Date  . Anxiety disorder, unspecified 02/17/2021  . Bipolar I disorder, current or most recent episode manic, severe ([place]) 02/17/2021  . OCD (obsessive compulsive disorder) 02/22/2021   History reviewed. No pertinent surgical history. Family History: History reviewed. No pertinent family history. Family Psychiatric  History: See H&P Social History:  Social History   Substance and Sexual Activity  Alcohol Use None     Social History   Substance and Sexual Activity  Drug Use Not on file    Social History   Socioeconomic History  . Marital status: Married    Spouse name: Not on file  . Number of children:  Not on file  . Years of education: Not on file  . Highest education level: Not on file  Occupational History  . Not on file  Tobacco Use  . Smoking status: Never Smoker  . Smokeless tobacco: Never Used  Substance and Sexual Activity  . Alcohol use: Not on file  . Drug use: Not on file  . Sexual activity: Not on file  Other Topics Concern  . Not on file  Social History Narrative  . Not on file   Social Determinants of Health    Financial Resource Strain: Not on file  Food Insecurity: Not on file  Transportation Needs: Not on file  Physical Activity: Not on file  Stress: Not on file  Social Connections: Not on file   Additional Social History:    Pain Medications: SEE MAR Prescriptions: SEE MAR Over the Counter: SEE MAR History of alcohol / drug use?: Yes Name of Substance 1: Alcohol 1 - Age of First Use: unknown 1 - Amount (size/oz): unknown 1 - Frequency: unknown 1 - Duration: unknown 1 - Last Use / Amount: 8 yrs ago 1 - Method of Aquiring: unknown 1- Route of Use: unknown                  Sleep: Good  Appetite:  Good  Current Medications: Current Facility-Administered Medications  Medication Dose Route Frequency Provider Last Rate Last Admin  . acetaminophen (TYLENOL) tablet 650 mg  650 mg Oral Q6H PRN Revonda Humphrey, NP      . alum & mag hydroxide-simeth (MAALOX/MYLANTA) 200-200-20 MG/5ML suspension 30 mL  30 mL Oral Q4H PRN Revonda Humphrey, NP      . hydrOXYzine (ATARAX/VISTARIL) tablet 25 mg  25 mg Oral TID PRN Revonda Humphrey, NP   25 mg at 02/21/21 2144  . lamoTRIgine (LAMICTAL) tablet 200 mg  200 mg Oral BID Revonda Humphrey, NP   200 mg at 02/22/21 0805  . lithium carbonate capsule 450 mg  450 mg Oral BID WC Arthor Captain, MD   450 mg at 02/22/21 0806  . magnesium hydroxide (MILK OF MAGNESIA) suspension 30 mL  30 mL Oral Daily PRN Revonda Humphrey, NP      . nicotine polacrilex (NICORETTE) gum 2 mg  2 mg Oral PRN Lindell Spar I, NP   2 mg at 02/22/21 1248  . propranolol (INDERAL) tablet 40 mg  40 mg Oral BID Revonda Humphrey, NP   40 mg at 02/22/21 0805  . risperiDONE (RISPERDAL) tablet 1 mg  1 mg Oral q morning Arthor Captain, MD   1 mg at 02/22/21 3976  . risperiDONE (RISPERDAL) tablet 1.5 mg  1.5 mg Oral QHS Arthor Captain, MD   1.5 mg at 02/21/21 2144  . traZODone (DESYREL) tablet 50 mg  50 mg Oral QHS PRN Prescilla Sours, PA-C   50 mg at 02/21/21 2143     Lab Results: No results found for this or any previous visit (from the past 48 hour(s)).  Blood Alcohol level:  No results found for: [employer] [place]  Metabolic Disorder Labs: Lab Results  Component Value Date   HGBA1C 5.3 02/17/2021   MPG 105.41 02/17/2021   No results found for: PROLACTIN Lab Results  Component Value Date   CHOL 202 (H) 02/17/2021   TRIG 101 02/17/2021   HDL 36 (L) 02/17/2021   CHOLHDL 5.6 02/17/2021   VLDL 20 02/17/2021   LDLCALC 146 (H) 02/17/2021  Physical Findings: AIMS: Facial and Oral Movements Muscles of Facial Expression: None, normal Lips and Perioral Area: None, normal Jaw: None, normal Tongue: None, normal,Extremity Movements Upper (arms, wrists, hands, fingers): None, normal Lower (legs, knees, ankles, toes): None, normal, Trunk Movements Neck, shoulders, hips: None, normal, Overall Severity Severity of abnormal movements (highest score from questions above): None, normal Incapacitation due to abnormal movements: None, normal Patient's awareness of abnormal movements (rate only patient's report): No Awareness, Dental Status Current problems with teeth and/or dentures?: No Does patient usually wear dentures?: No  CIWA:    COWS:     Musculoskeletal: Strength & Muscle Tone: within normal limits Gait & Station: normal Patient leans: N/A  Psychiatric Specialty Exam:  Presentation  General Appearance: Appropriate for Environment; Casual; Well Groomed  Eye Contact:Good  Speech:Clear and Coherent; Normal Rate; Other (comment) (Verbose but not pressured)  Speech Volume:Normal  Handedness:Right   Mood and Affect  Mood:Anxious  Affect:Congruent   Thought Process  Thought Processes:Coherent; Other (comment) (Intermittently tangential.  Perseverative on marital issues.)  Descriptions of Associations:Tangential  Orientation:Full (Time, Place and Person)  Thought Content:Perseveration; Tangential (Perseverative on marital  issues)  History of Schizophrenia/Schizoaffective disorder:No  Duration of Psychotic Symptoms:Less than six months  Hallucinations:Hallucinations: None  Ideas of Reference:None  Suicidal Thoughts:Suicidal Thoughts: No  Homicidal Thoughts:Homicidal Thoughts: No   Sensorium  Memory:Immediate Good; Remote Fair; Recent Fair  Judgment:Other (comment) (Limited)  Insight:Shallow   Executive Functions  Concentration:Fair  Attention Span:Fair  [place]  Language:Good   Psychomotor Activity  Psychomotor Activity:Psychomotor Activity: Normal   Assets  Assets:Desire for Improvement; Financial Resources/Insurance; Housing; Physical Health; Resilience; Social Support   Sleep  Sleep:Sleep: Good    Physical Exam: Physical Exam Vitals and nursing note reviewed.  HENT:     Head: Normocephalic and atraumatic.  Neurological:     General: No focal deficit present.     Mental Status: He is alert and oriented to person, place, and time.    Review of Systems  Constitutional: Negative for chills, diaphoresis and fever.  HENT: Negative for hearing loss and sore throat.   Eyes: Negative for blurred vision.  Respiratory: Negative for cough and shortness of breath.   Cardiovascular: Negative for chest pain and palpitations.  Gastrointestinal: Negative for constipation, diarrhea, nausea and vomiting.  Genitourinary: Negative for dysuria.  Musculoskeletal: Negative.   Skin: Negative.   Neurological: Negative for dizziness, tremors and headaches.  Psychiatric/Behavioral: Negative for depression, hallucinations and suicidal ideas. The patient is nervous/anxious. The patient does not have insomnia.    Blood pressure 125/83, pulse 97, temperature 97.6 F (36.4 C), temperature source Oral, resp. rate 18, height 6' (1.829 m), weight 91.2 kg, SpO2 100 %. Body mass index is 27.26 kg/m.   Treatment Plan Summary: 45 year old male with history of bipolar  disorder, GAD and OCD-like symptoms admitted for symptoms of intermittent anger outbursts, mood lability, agitation, irrational thinking, poor sleep, decreased appetite, intrusiveness and paranoid concerns that his wife is cheating on him.  Mood symptoms have improved with increase in risperidone and initiation of lithium carbonate.  He is reporting decreased intrusive thoughts about his marriage.  Suspect this concern is obsessive rather than delusional in nature given his history of OCD-like symptoms.  Patient is nearing readiness for discharge and the plan will be for him to receive CBT after discharge to target his OCD-like symptoms.  He has declined treatment with an SSRI or other medication to reduce OCD symptoms.  Daily contact with patient  to assess and evaluate symptoms and progress in treatment and Medication management   Continue every 15-minute observation status  Encouraged participation in group therapy and therapeutic milieu  Bipolar disorder, MRE episode manic with possible psychotic features -Continue lithium carbonate 450 mg twice daily for mood stabilization.  We will check lithium level, metabolic panel and TSH with a.m. draw. -Continue Lamictal 200 mg twice daily for mood stabilization -Continue risperidone 1 mg every morning and 1.5 mg nightly for mood stabilization and to target possible psychotic symptoms  Anxiety/OCD symptoms -Offered trial of SSRI for treatment of OCD symptoms.  Patient has declined SSRI treatment.  He has taken SSRIs in the past and disliked the side effects. -Continue hydroxyzine 25 mg 3 times daily PRN anxiety -Patient would benefit from participation in CBT therapy after discharge.  I discussed this type of treatment with him and he is receptive to participating in CBT therapy.  He currently has a therapist at [facility] whom he states does CBT.  Insomnia -Continue trazodone 50 mg at bedtime PRN  Migraine headaches  -Continue propranolol/Inderal  40 mg twice daily  Social work is working on disposition planning and aftercare plans   Arthor Captain, MD 02/22/2021, 4:58 PM

## 2021-02-23 LAB — COMPREHENSIVE METABOLIC PANEL
ALT: 26 U/L (ref 0–44)
AST: 18 U/L (ref 15–41)
Albumin: 4.1 g/dL (ref 3.5–5.0)
Alkaline Phosphatase: 57 U/L (ref 38–126)
Anion gap: 9 (ref 5–15)
BUN: 14 mg/dL (ref 6–20)
CO2: 28 mmol/L (ref 22–32)
Calcium: 9.2 mg/dL (ref 8.9–10.3)
Chloride: 102 mmol/L (ref 98–111)
Creatinine, Ser: 1.05 mg/dL (ref 0.61–1.24)
GFR, Estimated: >60 mL/min (ref >60)
Glucose, Bld: 101 mg/dL — ABNORMAL HIGH (ref 70–99)
Potassium: 4 mmol/L (ref 3.5–5.1)
Sodium: 139 mmol/L (ref 135–145)
Total Bilirubin: 0.2 mg/dL — ABNORMAL LOW (ref 0.3–1.2)
Total Protein: 6.9 g/dL (ref 6.5–8.1)

## 2021-02-23 LAB — LITHIUM LEVEL: Lithium Lvl: 0.39 mmol/L — ABNORMAL LOW (ref 0.60–1.20)

## 2021-02-23 LAB — TSH: TSH: 3.482 u[IU]/mL (ref 0.350–4.500)

## 2021-02-23 MED ORDER — RISPERIDONE 1 MG PO TABS: 1.0000 mg | ORAL_TABLET | Freq: Every morning | ORAL | 1 refills | Status: DC

## 2021-02-23 MED ORDER — LAMOTRIGINE 200 MG PO TABS: 200.0000 mg | ORAL_TABLET | Freq: Two times a day (BID) | ORAL | 1 refills | Status: DC

## 2021-02-23 MED ORDER — HYDROXYZINE HCL 25 MG PO TABS: 25.0000 mg | ORAL_TABLET | Freq: Three times a day (TID) | ORAL | 1 refills | Status: DC | PRN

## 2021-02-23 MED ORDER — TRAZODONE HCL 50 MG PO TABS: 50.0000 mg | ORAL_TABLET | Freq: Every evening | ORAL | 1 refills | Status: DC | PRN

## 2021-02-23 MED ORDER — LITHIUM CARBONATE 150 MG PO CAPS: 450.0000 mg | ORAL_CAPSULE | Freq: Two times a day (BID) | ORAL | 1 refills | Status: DC

## 2021-02-23 NOTE — [facility] Suicide Risk Assessment (Signed)
[facility] Discharge Suicide Risk Assessment   Principal Problem: Bipolar I disorder, current or most recent episode manic, severe ([facility]) Discharge Diagnoses: Principal Problem:   Bipolar I disorder, current or most recent episode manic, severe ([facility]) Active Problems:   MDD (major depressive disorder), recurrent episode, severe ([facility])   Anxiety disorder, unspecified   OCD (obsessive compulsive disorder)   Total Time spent with patient: 15 minutes  Musculoskeletal: Strength & Muscle Tone: within normal limits Gait & Station: normal Patient leans: N/A  Psychiatric Specialty Exam: Review of Systems  Constitutional: Negative.   HENT: Negative.   Respiratory: Negative.   Cardiovascular: Negative.   Gastrointestinal: Negative.   Genitourinary: Negative.   Musculoskeletal: Negative.   Neurological: Negative.   Psychiatric/Behavioral: Negative.  Negative for dysphoric mood, hallucinations, self-injury, sleep disturbance and suicidal ideas. The patient is not nervous/anxious.     Blood pressure 115/86, pulse 82, temperature (!) 97.4 F (36.3 C), temperature source Oral, resp. rate 18, height 6' (1.829 m), weight 91.2 kg, SpO2 97 %.Body mass index is 27.26 kg/m.  General Appearance: Casual and Neat  Eye Contact::  Good  Speech:  Clear and Coherent and Normal Rate  Volume:  Normal  Mood:  Euthymic  Affect:  Appropriate and Congruent  Thought Process:  Coherent, Goal Directed and Linear  Orientation:  Full (Time, Place, and Person)  Thought Content:  Logical  Suicidal Thoughts:  No  Homicidal Thoughts:  No  Memory:  Immediate;   Good Recent;   Good Remote;   Good  Judgement:  Fair  Insight:  Fair  Psychomotor Activity:  Normal  Concentration:  Good  Recall:  Good  Fund of Knowledge:Good  Language: Good  Akathisia:  No    AIMS (if indicated):     Assets:  Communication Skills Desire for Improvement Financial Resources/Insurance Housing Physical Health Resilience Social  Support  Sleep:  Number of Hours: 6.75  Cognition: WNL  ADL's:  Intact   Mental Status Per Nursing Assessment::   On Admission:  NA  Demographic Factors:  Male and Caucasian  Loss Factors: NA  Historical Factors: Family history of mental illness or substance abuse, Impulsivity and military combat veteran  Risk Reduction Factors:   Responsible for children under 60 years of age, Sense of responsibility to family, Living with another person, especially a relative, Positive social support and Positive therapeutic relationship  Continued Clinical Symptoms:  Anxiety - improved Bipolar Disorder - currently euthymic Obsessive-Compulsive Disorder - improved More than one psychiatric diagnosis Previous Psychiatric Diagnoses and Treatments  Cognitive Features That Contribute To Risk:  Polarized thinking and Thought constriction (tunnel vision)    Suicide Risk:  Minimal: No identifiable suicidal ideation.  Patients presenting with no risk factors but with morbid ruminations; may be classified as minimal risk based on the severity of the depressive symptoms   Follow-up Information    [facility]. Go on 03/01/2021.   Specialty: Behavioral Health Why: You have a hospital discharge appointment for therapy and medication management services on 03/01/21 at 2:00 pm.  This appointment will be held in person.  Please go to:  ([address], 2nd floor)  Contact information: [address] [place] [address] [phone]       [facility] Follow up on 03/03/2021.   Specialty: Behavioral Health Why: You have an appointment with Helmut Muster for therapy services on 03/03/21 at 9:00 am. Contact information: [address] [place] [address] [phone]  [facility] Follow up on 04/17/2021.   Specialty: Behavioral Health Why: You have an appointment for medication  management services on 04/17/21 at 11:00 am.  This will be a Virtual appointment. Contact information: [address] [place] [address] [phone]              Plan Of Care/Follow-up recommendations:  Activity:  as tolerated  Tests:   Your outpatient doctor will periodically order lab work to have the level of lithium and your kidney function and thyroid function checked while you are taking lithium. Additionally, your outpatient doctor will periodically order lab work to have your blood sugar and cholesterol levels checked while you are taking risperidone.  Other:   - Take medications as prescribed.   - Do not drink alcohol.   - Do not use marijuana or other drugs.   - Keep outpatient therapy and psychiatric medication management appointments.   - Participate in cognitive behavior therapy to address your symptoms of obsessive compulsive disorder.   - See your primary care doctor for management and treatment of any medical problems or physical issues.    Claudie Revering, MD 02/23/2021, 7:36 AM

## 2021-02-23 NOTE — Discharge Instructions (Signed)
[facility]
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
## 2021-02-23 NOTE — Progress Notes (Signed)
   02/23/21 0500  Sleep  Number of Hours 6.75   

## 2021-02-23 NOTE — Progress Notes (Signed)
DAR Note: Patient denies SI/HI/AVH and currently verbalizes readiness for discharge. Pt is being maintained on Q15 minute safety checks and all meds have been given as ordered. Patient has been educated on all of his discharge instructions and has verbalized understanding. Currently awaiting transportation home. Pt verbally contracts for safety outside the hospital.   02/23/21 1044  Psych Admission Type (Psych Patients Only)  Admission Status Involuntary  Psychosocial Assessment  Patient Complaints None  Eye Contact Fair  Facial Expression Anxious  Affect Appropriate to circumstance  Speech Logical/coherent  Interaction Assertive  Motor Activity Slow  Appearance/Hygiene Unremarkable  Behavior Characteristics Cooperative  Mood Euthymic  Thought Process  Coherency WDL  Content WDL  Delusions None reported or observed  Perception WDL  Hallucination None reported or observed  Judgment WDL  Confusion None  Danger to Self  Current suicidal ideation? Denies  Danger to Others  Danger to Others None reported or observed

## 2021-02-23 NOTE — Progress Notes (Signed)
Patient ID: Andrew Kerr, male   DOB: 05-06-1976, 45 y.o.   MRN: [id number] Patient's wife met pt in the lobby and provided him with transportation home. Pt left the hospital with all of his personal belongings and discharge instructions

## 2021-02-23 NOTE — Progress Notes (Signed)
  [facility] Adult Case Management Discharge Plan :  Will you be returning to the same living situation after discharge:  Yes,  personal home At discharge, do you have transportation home?: Yes,  via wife Do you have the ability to pay for your medications: Yes,  private insurance and income  Release of information consent forms completed and in the chart;  Patient's signature needed at discharge.  Patient to Follow up at:  Follow-up Information    [facility]. Go on 03/01/2021.   Specialty: Behavioral Health Why: You have a hospital discharge appointment for therapy and medication management services on 03/01/21 at 2:00 pm.  This appointment will be held in person.  Please go to:  (Building [address], 2nd floor)  Contact information: [address] [place] [address] [phone]       [facility] Follow up on 03/03/2021.   Specialty: Behavioral Health Why: You have an appointment with Helmut Muster for therapy services on 03/03/21 at 9:00 am. Contact information: [address] [place] [address] [phone]       [facility] Follow up on 04/17/2021.   Specialty: Behavioral Health Why: You have an appointment for medication management services on 04/17/21 at 11:00 am.  This will be a Virtual appointment. Contact information: [address] [place] [address] [phone]              Next level of care provider has access to [facility] Link:yes  Safety Planning and Suicide Prevention discussed: Yes,  w/ wife  Have you used any form of tobacco in the last 30 days? (Cigarettes, Smokeless Tobacco, Cigars, and/or Pipes): Patient Refused Screening  Has patient been referred to the Quitline?: N/A patient is not a smoker  Patient has been referred for addiction treatment: N/A  Felizardo Hoffmann, LCSWA 02/23/2021, 9:08 AM

## 2021-02-23 NOTE — Progress Notes (Signed)
Pt visible on the unit this evening.     02/23/21 0100  Psych Admission Type (Psych Patients Only)  Admission Status Involuntary  Psychosocial Assessment  Patient Complaints Depression  Eye Contact Fair  Facial Expression Anxious  Affect Appropriate to circumstance  Speech Logical/coherent  Interaction Assertive  Motor Activity Slow  Appearance/Hygiene Unremarkable  Behavior Characteristics Cooperative  Mood Anxious  Thought Process  Coherency WDL  Content WDL  Delusions None reported or observed  Perception WDL  Hallucination None reported or observed  Judgment WDL  Confusion None  Danger to Self  Current suicidal ideation? Denies  Danger to Others  Danger to Others None reported or observed

## 2021-03-03 ENCOUNTER — Ambulatory Visit (INDEPENDENT_AMBULATORY_CARE_PROVIDER_SITE_OTHER): Admitting: Psychology

## 2021-03-03 DIAGNOSIS — F4312 Post-traumatic stress disorder, chronic: Secondary | ICD-10-CM

## 2021-03-03 DIAGNOSIS — F332 Major depressive disorder, recurrent severe without psychotic features: Secondary | ICD-10-CM | POA: Diagnosis not present

## 2021-03-09 ENCOUNTER — Ambulatory Visit: Admitting: Psychology

## 2021-04-13 ENCOUNTER — Ambulatory Visit (INDEPENDENT_AMBULATORY_CARE_PROVIDER_SITE_OTHER): Admitting: Psychology

## 2021-04-13 DIAGNOSIS — F332 Major depressive disorder, recurrent severe without psychotic features: Secondary | ICD-10-CM | POA: Diagnosis not present

## 2021-04-13 DIAGNOSIS — F4312 Post-traumatic stress disorder, chronic: Secondary | ICD-10-CM

## 2021-04-17 ENCOUNTER — Ambulatory Visit (INDEPENDENT_AMBULATORY_CARE_PROVIDER_SITE_OTHER): Admitting: Psychiatry

## 2021-04-17 ENCOUNTER — Encounter (HOSPITAL_COMMUNITY): Payer: Self-pay | Admitting: Psychiatry

## 2021-04-17 DIAGNOSIS — F3113 Bipolar disorder, current episode manic without psychotic features, severe: Secondary | ICD-10-CM

## 2021-04-17 DIAGNOSIS — F411 Generalized anxiety disorder: Secondary | ICD-10-CM

## 2021-04-17 LAB — TSH: TSH: 3.42 mIU/L (ref 0.40–4.50)

## 2021-04-17 MED ORDER — LITHIUM CARBONATE 150 MG PO CAPS: 450.0000 mg | ORAL_CAPSULE | Freq: Two times a day (BID) | ORAL | 0 refills | Status: DC

## 2021-04-17 MED ORDER — BUSPIRONE HCL 7.5 MG PO TABS: 7.5000 mg | ORAL_TABLET | Freq: Every day | ORAL | 0 refills | Status: DC

## 2021-04-17 MED ORDER — RISPERIDONE 1 MG PO TABS: 1.0000 mg | ORAL_TABLET | Freq: Every morning | ORAL | 1 refills | Status: DC

## 2021-04-17 NOTE — Progress Notes (Signed)
Psychiatric Initial Adult Assessment   Patient Identification: Andrew Kerr MRN:  [id number] Date of Evaluation:  04/17/2021 Referral Source: Hospital discharge Chief Complaint:  establish care, bipolar disorder Visit Diagnosis:    ICD-10-CM   1. Bipolar I disorder, current or most recent episode manic, severe (HCC)  F31.13 Lithium level    TSH    2. GAD (generalized anxiety disorder)  F41.1      Virtual Visit via Video Note  I connected with Andrew Kerr on 04/17/21 at 11:00 AM EDT by a video enabled telemedicine application and verified that I am speaking with the correct person using two identifiers.  Location: Patient: parked car with wife Provider: home office   I discussed the limitations of evaluation and management by telemedicine and the availability of in person appointments. The patient expressed understanding and agreed to proceed.      I discussed the assessment and treatment plan with the patient. The patient was provided an opportunity to ask questions and all were answered. The patient agreed with the plan and demonstrated an understanding of the instructions.   The patient was advised to call back or seek an in-person evaluation if the symptoms worsen or if the condition fails to improve as anticipated.  I provided 55 minutes of non-face-to-face time during this encounter. Including review and documentation.   History of Present Illness: Patient is a 45 years old currently married Caucasian male discharged from hospital last month after being admitted for a possible manic episode with paranoia he has been working as a Oceanographer married for 13 years he has 9 kids 18 of the 9 kids are living with them  Currently is not working that has been stressful and also was having poor sleep prior to admission and some medication change including Latuda was changed to Risperdal he was also on Vyvanse for inattention  According to hospital note see below at time of  admission  Medical record reviewed.  Patient's case discussed in detail with members of the treatment team and nursing staff.  I met with and evaluated the patient today in the patient's room on the unit.  Andrew Kerr is a 45 year old male with prior diagnoses of unspecified mood disorder, generalized anxiety disorder, ADHD and adjustment disorder with mixed disturbance of emotions and conduct who presented as a walk-in brought in by his wife to [facility] chief complaint of "I know my wife is cheating on me."  Per chart review on admission interview at [facility] the patient was anxious, stressed, illogical, intermittently tearful, tangential and reported paranoid concerns that his wife is cheating on him.  He also reported that he had not eaten or slept in days.  Additionally the patient made vague statements that "everyone would be better off without me."  Per chart review, collateral information obtained from patient's spouse indicates that patient has had anger outbursts, irrational thinking, agitation, mood lability, intrusiveness, poor sleep,"  Patient according to hospital discharge summary was stabilized on medication weaning lithium, Lamictal Resporal dose was adjusted and also was given hydroxyzine and trazodone he did fairly well regarding mood symptoms agitation at discharge  Patient was interviewed today along with his wife and wife also mentioned he is not as edgy and irritable he is not taking trazodone 50 mg since it was making him feel groggy and also feeling like a zombie with the medication and had to get started back by Vyvanse with [place] inattention specialist he is at a dose of 25 mg 2 times a  day without Vyvanse he was feeling like a zombie is also not taking hydroxyzine on a regular basis it was causing him more anxiety  In general mood symptoms are manageable does not endorse paranoia or feeling of intrusive thoughts or thoughts of his wife cheating or hallucinations as of now  He does  worry he does worry at times excessive worries related to finances and also prior job stress which he is not doing as of now  He has a supportive wife denies any acute stress at home  Gives history of episodes of depression or mood is being up and down with depressive episodes and then episodes in which he would feel hyped up distracted racing thoughts   Also endorses getting stuck in some routine or following up some compulsions which are manageable as of now  He is able to sleep with half of trazodone but will try without it and we talked about sleep hygiene as he feels groggy with the trazodone  He is a Actor served for 21 years he could not pursue mental health while in the [place] does not endorse any active combat from [employer] or flashbacks  He does follow with the [place] hospital was following with her medication management therapy he continues to do therapy and regarding to his mental health but wants to change his medication services to [facility]   Denies drug use denies alcohol use  Denies past psychiatric admission except this last month  Aggravating factors; prior job stress including substitute teaching.  Some regular stressors at home Modifying factors wife, kids, retired Actor  Duration acute episode was 2 months ago Severity improved Side effects denied does not have any rash on Lamictal or any tremors  We will request for lithium level today   Past Psychiatric History: mood swings, depression  Previous Psychotropic Medications: Yes   Substance Abuse History in the last 12 months:  No.  Consequences of Substance Abuse: NA  Past Medical History:  Past Medical History:  Diagnosis Date   Anxiety disorder, unspecified 02/17/2021   Bipolar I disorder, current or most recent episode manic, severe ([place]) 02/17/2021   OCD (obsessive compulsive disorder) 02/22/2021   History reviewed. No pertinent surgical history.  Family Psychiatric History: mom  :  bipolar  Family History: History reviewed. No pertinent family history.  Social History:   Social History   Socioeconomic History   Marital status: Married    Spouse name: Not on file   Number of children: Not on file   Years of education: Not on file   Highest education level: Not on file  Occupational History   Not on file  Tobacco Use   Smoking status: Never   Smokeless tobacco: Never  Substance and Sexual Activity   Alcohol use: Not on file   Drug use: Not on file   Sexual activity: Not on file  Other Topics Concern   Not on file  Social History Narrative   Not on file   Social Determinants of Health   Financial Resource Strain: Not on file  Food Insecurity: Not on file  Transportation Needs: Not on file  Physical Activity: Not on file  Stress: Not on file  Social Connections: Not on file    Additional Social History: grew up with mom, mom had bipolar, otherwise denies any specific trauma Married and Therapist, art veteran  Allergies:   Allergies  Allergen Reactions   Zoloft [Sertraline Hcl] Itching and Other (See Comments)    Metabolic  Disorder Labs: Lab Results  Component Value Date   HGBA1C 5.3 02/17/2021   MPG 105.41 02/17/2021   No results found for: PROLACTIN Lab Results  Component Value Date   CHOL 202 (H) 02/17/2021   TRIG 101 02/17/2021   HDL 36 (L) 02/17/2021   CHOLHDL 5.6 02/17/2021   VLDL 20 02/17/2021   LDLCALC 146 (H) 02/17/2021   Lab Results  Component Value Date   TSH 3.482 02/23/2021    Therapeutic Level Labs: Lab Results  Component Value Date   LITHIUM 0.39 (L) 02/23/2021   No results found for: CBMZ No results found for: VALPROATE  Current Medications: Current Outpatient Medications  Medication Sig Dispense Refill   busPIRone (BUSPAR) 7.5 MG tablet Take 1 tablet (7.5 mg total) by mouth daily. 30 tablet 0   lamoTRIgine (LAMICTAL) 200 MG tablet Take 1 tablet (200 mg total) by mouth 2 (two) times daily. 60 tablet 1   lithium  carbonate 150 MG capsule Take 3 capsules (450 mg total) by mouth 2 (two) times daily with a meal. 180 capsule 0   propranolol (INDERAL) 20 MG tablet Take 40 mg by mouth 2 (two) times daily.     risperiDONE (RISPERDAL) 1 MG tablet Take 1 tablet (1 mg total) by mouth every morning. And take 1 1/2 tablets (1.5 mg) at bedtime 75 tablet 1   No current facility-administered medications for this visit.    Psychiatric Specialty Exam: Review of Systems  Psychiatric/Behavioral:  Negative for agitation, dysphoric mood, hallucinations and self-injury.    There were no vitals taken for this visit.There is no height or weight on file to calculate BMI.  General Appearance: Casual  Eye Contact:  Fair  Speech:  Normal Rate  Volume:  Decreased  Mood:  Euthymic  Affect:  Constricted  Thought Process:  Goal Directed  Orientation:  Full (Time, Place, and Person)  Thought Content:  Logical  Suicidal Thoughts:  No  Homicidal Thoughts:  No  Memory:  Recent;   Good  Judgement:  Fair  Insight:  Shallow  Psychomotor Activity:  Normal  Concentration:  Concentration: Fair  Recall:  [place] of Knowledge:Good  Language: Good  Akathisia:  No  Handed:    AIMS (if indicated):  no involuntary movements  Assets:  Housing Intimacy Leisure Time Physical Health  ADL's:  Intact  Cognition: WNL  Sleep:   variable   Screenings: AIMS    Flowsheet Row Admission (Discharged) from OP Visit from 02/16/2021 in [facility]  AIMS Total Score 0      PHQ2-9    [place] Office Visit from 04/17/2021 in [place]  PHQ-2 Total Score 1      [place] Office Visit from 04/17/2021 in [place] Admission (Discharged) from OP Visit from 02/16/2021 in [facility]  C-SSRS RISK CATEGORY No Risk No Risk       Assessment and Plan: as follows  Bipolar disorder  current episode manic : improved on medications since discharge. Will continue risperdal 43m and 1.569m current dose of lamcital and lithium, sent request for lithium levels  GAD: not taking vistaril, was making more anxious and croggy with trazadone, have stopped vistaril, will start low dose buspar for anxiety, also on inderal for migraines Cautioned about vyvanse can lead to stimulation and keep under observation for mania, they do not want to lower the dose as he feel zombie without  it and distracted Vyvanse can also cause sleep issues, dicussed sleep hygiene  FU 4 weeks, continue therapy, weight mainteance, add activities during the day      Merian Capron, MD 6/27/202211:43 AM

## 2021-04-18 LAB — LITHIUM LEVEL: Lithium Lvl: 0.8 mmol/L (ref 0.6–1.2)

## 2021-04-20 ENCOUNTER — Ambulatory Visit (INDEPENDENT_AMBULATORY_CARE_PROVIDER_SITE_OTHER): Admitting: Psychology

## 2021-04-20 DIAGNOSIS — F332 Major depressive disorder, recurrent severe without psychotic features: Secondary | ICD-10-CM | POA: Diagnosis not present

## 2021-04-20 DIAGNOSIS — F4312 Post-traumatic stress disorder, chronic: Secondary | ICD-10-CM | POA: Diagnosis not present

## 2021-04-23 ENCOUNTER — Other Ambulatory Visit (HOSPITAL_COMMUNITY): Payer: Self-pay | Admitting: Psychiatry

## 2021-04-28 ENCOUNTER — Ambulatory Visit (INDEPENDENT_AMBULATORY_CARE_PROVIDER_SITE_OTHER): Admitting: Psychology

## 2021-04-28 DIAGNOSIS — F332 Major depressive disorder, recurrent severe without psychotic features: Secondary | ICD-10-CM

## 2021-04-28 DIAGNOSIS — F4312 Post-traumatic stress disorder, chronic: Secondary | ICD-10-CM

## 2021-04-30 ENCOUNTER — Ambulatory Visit (HOSPITAL_COMMUNITY)
Admission: RE | Admit: 2021-04-30 | Discharge: 2021-04-30 | Disposition: A | Discharge status: Home / Self Care | Attending: Psychiatry | Admitting: Psychiatry

## 2021-04-30 ENCOUNTER — Other Ambulatory Visit: Payer: Self-pay

## 2021-04-30 ENCOUNTER — Emergency Department (HOSPITAL_COMMUNITY)
Admission: EM | Admit: 2021-04-30 | Discharge: 2021-04-30 | Disposition: A | Discharge status: Home / Self Care | Attending: Emergency Medicine | Admitting: Emergency Medicine

## 2021-04-30 ENCOUNTER — Encounter (HOSPITAL_COMMUNITY): Payer: Self-pay | Admitting: Emergency Medicine

## 2021-04-30 DIAGNOSIS — F3113 Bipolar disorder, current episode manic without psychotic features, severe: Secondary | ICD-10-CM | POA: Diagnosis present

## 2021-04-30 DIAGNOSIS — Z5321 Procedure and treatment not carried out due to patient leaving prior to being seen by health care provider: Secondary | ICD-10-CM | POA: Insufficient documentation

## 2021-04-30 DIAGNOSIS — F309 Manic episode, unspecified: Secondary | ICD-10-CM | POA: Insufficient documentation

## 2021-04-30 DIAGNOSIS — F429 Obsessive-compulsive disorder, unspecified: Secondary | ICD-10-CM | POA: Diagnosis present

## 2021-04-30 DIAGNOSIS — F4310 Post-traumatic stress disorder, unspecified: Secondary | ICD-10-CM | POA: Diagnosis present

## 2021-04-30 HISTORY — DX: Migraine, unspecified, not intractable, without status migrainosus: G43.909

## 2021-04-30 LAB — COMPREHENSIVE METABOLIC PANEL
ALT: 24 U/L (ref 0–44)
AST: 21 U/L (ref 15–41)
Albumin: 4.1 g/dL (ref 3.5–5.0)
Alkaline Phosphatase: 67 U/L (ref 38–126)
Anion gap: 9 (ref 5–15)
BUN: 12 mg/dL (ref 6–20)
CO2: 26 mmol/L (ref 22–32)
Calcium: 8.9 mg/dL (ref 8.9–10.3)
Chloride: 103 mmol/L (ref 98–111)
Creatinine, Ser: 1.26 mg/dL — ABNORMAL HIGH (ref 0.61–1.24)
GFR, Estimated: >60 mL/min (ref >60)
Glucose, Bld: 119 mg/dL — ABNORMAL HIGH (ref 70–99)
Potassium: 4 mmol/L (ref 3.5–5.1)
Sodium: 138 mmol/L (ref 135–145)
Total Bilirubin: 0.7 mg/dL (ref 0.3–1.2)
Total Protein: 7 g/dL (ref 6.5–8.1)

## 2021-04-30 LAB — CBC
HCT: 41.2 % (ref 39.0–52.0)
Hemoglobin: 13.7 g/dL (ref 13.0–17.0)
MCH: 32 pg (ref 26.0–34.0)
MCHC: 33.3 g/dL (ref 30.0–36.0)
MCV: 96.3 fL (ref 80.0–100.0)
Platelets: 214 10*3/uL (ref 150–400)
RBC: 4.28 MIL/uL (ref 4.22–5.81)
RDW: 12.5 % (ref 11.5–15.5)
WBC: 6.1 10*3/uL (ref 4.0–10.5)
nRBC: 0 % (ref 0.0–0.2)

## 2021-04-30 LAB — ACETAMINOPHEN LEVEL: Acetaminophen (Tylenol), Serum: <10 ug/mL — ABNORMAL LOW (ref 10–30)

## 2021-04-30 LAB — ETHANOL: Alcohol, Ethyl (B): <10 mg/dL (ref <10)

## 2021-04-30 LAB — SALICYLATE LEVEL: Salicylate Lvl: <7 mg/dL — ABNORMAL LOW (ref 7.0–30.0)

## 2021-04-30 NOTE — H&P (Signed)
Behavioral Health Medical Screening Exam  Andrew Kerr is an 45 y.o. male who presents to [facility] voluntarily with wife for assessment of increased feelings of guilt, worthlessness, irritability/anger, and possible hypomania. Patient presented to [facility] 04/28- 02/23/21 with similar presentation. Currently lives in [place] with his wife and 9 children. He is retired from [employer] after 21 years where he served through several combat tours. Patient has past history of PTSD, OCD, and Bipolar I disorder; is connected via [facility] [place].   Patient presents anxious with some pressured speech. Inconsistent eye contact. Patient states "I'm back again. Some of the same problems. I don't know. My wife is out there, she can tell you about it". Patient initially denied having any symptoms and states "I feel like I will be alright". He endorses thoughts of guilt and worthlessness; denies any active thoughts of suicide or homicide, auditory or visual hallucinations. He does appear to be minimizing symptoms, delusional, hypomanic, and possibly responding to some external/internal stimuli. Patient provided verbal permission to speak to his wife for collateral information.   Collateral: Linas Stepter (wife) [phone] States since discharge from [facility] patient changed psych providers from [place] because "they weren't helping" and began seeing provider through [facility]. States patient has had several medication changes since switch including Abilify which she states "snowed" patient. While at [facility] patient's Vyvanse was stopped due to acute mania; states patient was experiencing acute cognitive flattening and ADHD symptoms, medication was then restarted. Wife states around the same time family had tenants abandon rental property (farm) that caused increased stress in the home. Says patient has since not been sleeping or eating, perseverative and ruminating "over things", and recently had some increased aggression in his interactions with  the children. States patient was witnessed "picking" (projecting) towards kids and becoming verbally aggressive. She states patient began making paranoid statements again (previous admission) about her having an affair, is up pacing/ruminating throughout the night, and recently erased the contents of her phone. Over the past week patient has been taking "extra medication" in an attempt to "get some sleep" and made the statement he "no longer needs medication and should be fine without it". States patient has been making "concerning" statements of increased guilt, worthlessness, and "everyone would be better off if I weren't here". Reports patient has "deep" family history of borderline personality and bipolar disorder. Wife states "due to his history in the military is able to present well. 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 She states he is not safe in the home and unable to contract for safety or safety plan.   Total Time spent with patient: 20 minutes  Psychiatric Specialty Exam: Physical Exam Vitals and nursing note reviewed.  Constitutional:      General: He is not in acute distress.    Appearance: Normal appearance. He is normal weight. He is not ill-appearing, toxic-appearing or diaphoretic.  HENT:     Head: Normocephalic.  Cardiovascular:     Rate and Rhythm: Tachycardia present.  Musculoskeletal:     Cervical back: Normal range of motion.  Neurological:     Mental Status: He is alert.   Review of Systems There were no vitals taken for this visit.There is no height or weight on file to calculate BMI. General Appearance: Casual Eye Contact:  Fair Speech:  Clear and Coherent and Pressured Volume:  Normal Mood:  Anxious Affect:  Non-Congruent  Thought Process:  Descriptions of Associations: Circumstantial Orientation:  Full (Time, Place, and Person) Thought Content:  Illogical, Paranoid  Ideation, Rumination, and Tangential Suicidal Thoughts:   pt denies; ruminating thoughts of worthlessness and passive statements "family better off if I weren't here" Homicidal Thoughts:  No Memory:  Immediate;   Fair Recent;   Fair Remote;   Fair Judgement:  Impaired Insight:  Lacking Psychomotor Activity:  Increased Concentration: Concentration: Poor and Attention Span: Fair Recall:  [employer] of Knowledge:Good Language: Good Akathisia:  NA Handed:   AIMS (if indicated):    Assets:  Communication Skills Desire for Improvement Financial Resources/Insurance Housing Intimacy Physical Health Resilience Social Support Vocational/Educational Sleep:     Musculoskeletal: Strength & Muscle Tone: within normal limits Gait & Station: normal Patient leans: N/A  There were no vitals taken for this visit.  Recommendations: Based on my evaluation the patient does not appear to have an emergency medical condition. Patient is being recommended for inpatient psychiatric hospitalization for further observation, stabilization, and treatment.  [facility] at capacity; Unable to transfer to [facility] due to being out of the [place] resident. Report called to Dr Jeraldine Loots at [facility]; patient to be transferred to ED via transportation.   Loletta Parish, NP 04/30/2021, 3:37 PM

## 2021-04-30 NOTE — ED Notes (Signed)
Pt seen walking out of dept.

## 2021-04-30 NOTE — ED Triage Notes (Signed)
Pt states he is here because [facility] sent him here to wait for a bed.  Denies suicidal/homicidal ideation.  States, "I just need a bed and if y'all don't have one my wife can come pick me up."  Asked pt what he needed a bed for and he states he is "manic and having unrealistic thoughts."

## 2021-04-30 NOTE — BH Assessment (Signed)
Comprehensive Clinical Assessment (CCA) Note  04/30/2021 Gentry Roch [id number]  Disposition: Maxie Barb, NP, recommend inpatient treatment. Patient to be transferred to [facility]   Chief Complaint:  Mr. Dettman present to [facility] accompanied by his wife with complaints the client is manic. Mr. Budney is not sleeping well and he reports that things he knows are not real are starting to become real in his head. Mr. Nicklaus also reports he feels that his medication is not working anymore. Mr. Seufert wife collateral reports his delusional behaviors/thoughts are affecting relationships with his children/friends. He's accusing her of extra martial affairs. He's not attending to his daily hygiene.  Denied suicidal/homicidal ideations and denied auditory/visual hallucinations. Denied auditory hallucinations and starting to present visual hallucinations (manic).    Chief Complaint  Patient presents with   Psychiatric Evaluation   Manic Behavior    Mr. Mclure present to [facility] accompanied by his wife with complaints the client is manic. Mr. Baig is not sleeping well and he reports that things he knows are not real are starting to become real in his head. Mr. Petsch also reports he feels that his medication is not working anymore. Mr. Gariepy wife collateral reports his delusional behaviors/thoughts are affecting relationships with his children/friends. He's accusing her of extra martial affairs. He's not attending to his daily hygiene.      Visit Diagnosis: Bipolar   CCA Screening, Triage and Referral (STR)  Patient Reported Information How did you hear about [place]? Family/Friend  What Is the Reason for Your Visit/Call Today? referred by wife Derry Skill  How Long Has This Been Causing You Problems? 1 wk - 1 month  What Do You Feel Would Help You the Most Today? Stress Management; Medication(s)   Have You Recently Had Any Thoughts About Hurting Yourself? No  Are You Planning to Commit Suicide/Harm  Yourself At This time? No   Have you Recently Had Thoughts About Hurting Someone Karolee Ohs? No  Are You Planning to Harm Someone at This Time? No  Explanation: No data recorded  Have You Used Any Alcohol or Drugs in the Past 24 Hours? No  How Long Ago Did You Use Drugs or Alcohol? No data recorded What Did You Use and How Much? No data recorded  Do You Currently Have a Therapist/Psychiatrist? Yes  Name of Therapist/Psychiatrist: Research officer, political party with [facility] for medication management   Have You Been Recently Discharged From Any Public relations account executive or Programs? No  Explanation of Discharge From Practice/Program: No data recorded    CCA Screening Triage Referral Assessment Type of Contact: Face-to-Face  Telemedicine Service Delivery:   Is this Initial or Reassessment? No data recorded Date Telepsych consult ordered in CHL:  No data recorded Time Telepsych consult ordered in CHL:  No data recorded Location of Assessment: [facility]  Provider Location: [facility]   Collateral Involvement: Patient gavie verbal consent to speak with spouse   Does Patient Have a Automotive engineer Guardian? No data recorded Name and Contact of Legal Guardian: No data recorded If Minor and Not Living with Parent(s), Who has Custody? n/a  Is CPS involved or ever been involved? Never  Is APS involved or ever been involved? Never   Patient Determined To Be At Risk for Harm To Self or Others Based on Review of Patient Reported Information or Presenting Complaint? No  Method: No data recorded Availability of Means: No data recorded Intent: No data recorded Notification Required: No data recorded Additional Information  for Danger to Others Potential: No data recorded Additional Comments for Danger to Others Potential: No data recorded Are There Guns or Other Weapons in Your Home? No data recorded Types of Guns/Weapons: No data  recorded Are These Weapons Safely Secured?                            No data recorded Who Could Verify You Are Able To Have These Secured: No data recorded Do You Have any Outstanding Charges, Pending Court Dates, Parole/Probation? No data recorded Contacted To Inform of Risk of Harm To Self or Others: No data recorded   Does Patient Present under Involuntary Commitment? No  IVC Papers Initial File Date: No data recorded  [place] of Residence: [place]   Patient Currently Receiving the Following Services: Medication Management   Determination of Need: Urgent (48 hours)   Options For Referral: Medication Management     CCA Biopsychosocial Patient Reported Schizophrenia/Schizoaffective Diagnosis in Past: No   Strengths: unknown   Mental Health Symptoms Depression:   Difficulty Concentrating; Hopelessness; Fatigue; Change in energy/activity; Increase/decrease in appetite; Irritability; Sleep (too much or little); Tearfulness; Worthlessness; Weight gain/loss   Duration of Depressive symptoms:  Duration of Depressive Symptoms: Greater than two weeks   Mania:   Change in energy/activity; Euphoria; Increased Energy; Overconfidence; Racing thoughts; Recklessness; Irritability   Anxiety:    Sleep; Restlessness; Tension; Difficulty concentrating; Worrying   Psychosis:   Delusions   Duration of Psychotic symptoms:    Trauma:   None; N/A   Obsessions:   Cause anxiety; Poor insight; Disrupts routine/functioning; Intrusive/time consuming; Recurrent & persistent thoughts/impulses/images   Compulsions:   Disrupts with routine/functioning; Intrusive/time consuming; Not connected to stressor; Poor Insight; Repeated behaviors/mental acts   Inattention:   Disorganized; Avoids/dislikes activities that require focus; Does not seem to listen; Fails to pay attention/makes careless mistakes; Poor follow-through on tasks   Hyperactivity/Impulsivity:   Difficulty waiting turn;  Feeling of restlessness   Oppositional/Defiant Behaviors:   Aggression towards people/animals; Easily annoyed; Resentful   Emotional Irregularity:   Frantic efforts to avoid abandonment; Intense/inappropriate anger; Intense/unstable relationships; Mood lability; Potentially harmful impulsivity; Recurrent suicidal behaviors/gestures/threats   Other Mood/Personality Symptoms:  No data recorded   Mental Status Exam Appearance and self-care  Stature:   Average   Weight:   Average weight   Clothing:   Careless/inappropriate   Grooming:   Normal   Cosmetic use:   Age appropriate   Posture/gait:   Normal   Motor activity:   Restless   Sensorium  Attention:   Confused   Concentration:   Normal   Orientation:   X5   Recall/memory:   Normal   Affect and Mood  Affect:   Appropriate   Mood:   Anxious   Relating  Eye contact:   Normal   Facial expression:   Anxious   Attitude toward examiner:   Cooperative   Thought and Language  Speech flow:  Flight of Ideas   Thought content:   Personalizations; Persecutions; Suspicious   Preoccupation:   Ruminations   Hallucinations:   None   Organization:  No data recorded  [employer] of Knowledge:   Average   Intelligence:   Average   Abstraction:   Normal   Judgement:   Normal   Reality Testing:   Distorted   Insight:   None/zero insight   Decision Making:   Normal   Social Functioning  Social  Maturity:   Impulsive   Social Judgement:   Normal   Stress  Stressors:   Other (Comment)   Coping Ability:   Normal   Skill Deficits:   Activities of daily living   Supports:   Family     Religion:    Leisure/Recreation:    Exercise/Diet: Exercise/Diet Have You Gained or Lost A Significant Amount of Weight in the Past Six Months?: No Do You Follow a Special Diet?: No Do You Have Any Trouble Sleeping?: Yes Explanation of Sleeping Difficulties: patient  reports not sleeping for days   CCA Employment/Education Employment/Work Situation: Employment / Work Situation 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: Yes Has Patient ever Been in Equities trader?: Yes (Describe in comment) Field seismologist)  Education: Education Last Grade Completed:  (unk) Did You Product manager?:  (unknown) Did You Have An Individualized Education Program (IIEP): No Did You Have Any Difficulty At School?: No   CCA Family/Childhood History Family and Relationship History: Family history Does patient have children?: Yes How is patient's relationship with their children?: Per spouse....children are afraid of patien when he displays mood unastability  Childhood History:  Childhood History By whom was/is the patient raised?: Both parents Did patient suffer any verbal/emotional/physical/sexual abuse as a child?:  (unknown) Has patient ever been sexually abused/assaulted/raped as an adolescent or adult?:  (unknown) Witnessed domestic violence?:  (unknown) Has patient been affected by domestic violence as an adult?:  (unknown)  Child/Adolescent Assessment:     CCA Substance Use Alcohol/Drug Use:                           ASAM's:  Six Dimensions of Multidimensional Assessment  Dimension 1:  Acute Intoxication and/or Withdrawal Potential:      Dimension 2:  Biomedical Conditions and Complications:      Dimension 3:  Emotional, Behavioral, or Cognitive Conditions and Complications:     Dimension 4:  Readiness to Change:     Dimension 5:  Relapse, Continued use, or Continued Problem Potential:     Dimension 6:  Recovery/Living Environment:     ASAM Severity Score:    ASAM Recommended Level of Treatment:     Substance use Disorder (SUD)    Recommendations for Services/Supports/Treatments:    Discharge Disposition:    DSM5 Diagnoses: Patient Active Problem List   Diagnosis Date Noted   OCD (obsessive  compulsive disorder) 02/22/2021   Bipolar I disorder, current or most recent episode manic, severe (HCC) 02/17/2021   Anxiety disorder, unspecified 02/17/2021     Referrals to Alternative Service(s): Referred to Alternative Service(s):   Place:   Date:   Time:    Referred to Alternative Service(s):   Place:   Date:   Time:    Referred to Alternative Service(s):   Place:   Date:   Time:    Referred to Alternative Service(s):   Place:   Date:   Time:     Modesta Sammons, LCAS

## 2021-05-01 ENCOUNTER — Ambulatory Visit (HOSPITAL_COMMUNITY)
Admission: RE | Admit: 2021-05-01 | Discharge: 2021-05-01 | Disposition: A | Discharge status: Home / Self Care | Attending: Psychiatry | Admitting: Psychiatry

## 2021-05-01 ENCOUNTER — Inpatient Hospital Stay (HOSPITAL_COMMUNITY): Admission: AD | Admit: 2021-05-01 | Source: Intra-hospital | Admitting: Emergency Medicine

## 2021-05-01 DIAGNOSIS — U071 COVID-19: Secondary | ICD-10-CM | POA: Diagnosis not present

## 2021-05-01 DIAGNOSIS — F319 Bipolar disorder, unspecified: Secondary | ICD-10-CM | POA: Diagnosis present

## 2021-05-01 LAB — RESP PANEL BY RT-PCR (FLU A&B, COVID) ARPGX2
Influenza A by PCR: NEGATIVE
Influenza B by PCR: NEGATIVE
SARS Coronavirus 2 by RT PCR: POSITIVE — AB

## 2021-05-01 NOTE — BH Assessment (Addendum)
Comprehensive Clinical Assessment (CCA) Note  05/01/2021 Gentry Roch [id number]  DISPOSITIONClementeen Hoof NP recommends a inpatient admission to assist with stabilization.   Flowsheet Row OP Visit from [facility] from 04/30/2021 in [facility] EMERGENCY DEPARTMENT Office Visit from 04/17/2021 in [facility] [place]  C-SSRS RISK CATEGORY No Risk No Risk No Risk      The patient demonstrates the following risk factors for suicide: Chronic risk factors for suicide include: N/A. Acute risk factors for suicide include: N/A. Protective factors for this patient include: positive social support. Considering these factors, the overall suicide risk at this point appears to be low. Patient is not appropriate for outpatient follow up.   Patient is a 45 year old male that presents as a voluntary walk in to [facility]. Patient denies any S/I,H/I or AVH. Patient has a history of Bipolar I, generalized anxiety disorder, ADHD, PTSD and adjustment disorder with mixed disturbance of emotions and conduct per chart review. Patient states he contnues to have ongoing feelings of worthlessness, intrusive thoughts and episodes of mania the last week with decreased sleep reporting 3 hours or less a night due to medications needing to be adjusted. Patient also has been experiencing anger management issues and paranoia in reference to his wife having a affair. Patient is a veteran who has been receiving OP services from the [place] in [place] but has been dissatisfied with those services and recently switched providers now seeing Gilmore Laroche MD who assists with medication management for ongoing symptoms. Patient reports the transition between providers has "messed up his medications" that he states is the cause of presenting symptoms. See MAR in reference to medication management. Patient this date presents as anxious, stressed, circumstantial and finds it  difficult to answer questions at times stating it is "due to not sleeping for days." Patient contnues to voice ongoing paranoia and is concerned that his wife is cheating on him. Collateral from wife this date Cezar Misiaszek [phone]) reports this date that they share 9 children together and she is concerned about her welfare and that of the children stating that CPS has already been involved last year due to patient becoming angry while disciplining one of the children although that case per spouse has been closed at this time. Patient was seen yesterday at [facility] presenting with above symptoms meeting inpatient criteria and was sent to [facility] to await placement and left AMA because he didn't want to wait.   Per 04/30/21 Ezzard Standing RN writes in reference to that incident: Pt states he is here because [facility] sent him here to wait for a bed.  Denies suicidal/homicidal ideation.  States, "I just need a bed and if y'all don't have one my wife can come pick me up."  Asked pt what he needed a bed for and he states he is "manic and having unrealistic thoughts."    See note of 04/30/21 for additional information. Patient also had a inpatient admission in April at [facility]. Patient currently receives his OP counseling from Helmut Muster PhD for psychotherapy at [facility]. Patient denies any current SA issues stating he has been in recovery for two years.  Patient presents with a depressed mood with affect congruent. Patient is oriented x 5. Patient's thoughts are organized and memory intact. Patient does not appear to be responding to internal stimuli.     Chief Complaint:  Chief Complaint  Patient presents with   Psychiatric Evaluation   Visit Diagnosis:  Bipolar I disorder, current or most recent episode manic, severe    CCA Screening, Triage and Referral (STR)  Patient Reported Information How did you hear about us? Self  What Is the Reason for Your Visit/Call Today? Ongoing mania, problems sleeping, anger mang  issues, recent changes in medications  How Long Has This Been Causing You Problems? 1 wk - 1 month  What Do You Feel Would Help You the Most Today? Medication(s)   Have You Recently Had Any Thoughts About Hurting Yourself? No  Are You Planning to Commit Suicide/Harm Yourself At This time? No   Have you Recently Had Thoughts About Hurting Someone Karolee Ohslse? No  Are You Planning to Harm Someone at This Time? No  Explanation: No data recorded  Have You Used Any Alcohol or Drugs in the Past 24 Hours? No  How Long Ago Did You Use Drugs or Alcohol? No data recorded What Did You Use and How Much? No data recorded  Do You Currently Have a Therapist/Psychiatrist? Yes  Name of Therapist/Psychiatrist: Gilmore LarocheAkhtar MD   Have You Been Recently Discharged From Any Office Practice or Programs? No  Explanation of Discharge From Practice/Program: No data recorded    CCA Screening Triage Referral Assessment Type of Contact: Face-to-Face  Telemedicine Service Delivery:   Is this Initial or Reassessment? No data recorded Date Telepsych consult ordered in CHL:  No data recorded Time Telepsych consult ordered in CHL:  No data recorded [facility]  [facility]   Collateral Involvement: Spoke to patient's wife Lyla SonCarrie [phone]   Does Patient Have a Court Appointed Legal Guardian? No data recorded Name and Contact of Legal Guardian: No data recorded If Minor and Not Living with Parent(s), Who has Custody? NA  Is CPS involved or ever been involved? In the Past  Is APS involved or ever been involved? Never   Patient Determined To Be At Risk for Harm To Self or Others Based on Review of Patient Reported Information or Presenting Complaint? No  Method: No data recorded Availability of Means: No data recorded Intent: No data recorded Notification Required: No data recorded Additional Information for Danger to Others  Potential: No data recorded Additional Comments for Danger to Others Potential: No data recorded Are There Guns or Other Weapons in Your Home? No data recorded Types of Guns/Weapons: No data recorded Are These Weapons Safely Secured?                            No data recorded Who Could Verify You Are Able To Have These Secured: No data recorded Do You Have any Outstanding Charges, Pending Court Dates, Parole/Probation? No data recorded Contacted To Inform of Risk of Harm To Self or Others: Other: Comment (NA)    Does Patient Present under Involuntary Commitment? No  IVC Papers Initial File Date: No data recorded  [place]   Patient Currently Receiving the Following Services: Medication Management   Determination of Need: Emergent (2 hours)   Options For Referral: Outpatient Therapy     CCA Biopsychosocial Patient Reported Schizophrenia/Schizoaffective Diagnosis in Past: No   Strengths: unknown   Mental Health Symptoms Depression:   Difficulty Concentrating; Hopelessness; Fatigue; Change in energy/activity; Increase/decrease in appetite; Irritability; Sleep (too much or little); Tearfulness; Worthlessness; Weight gain/loss   Duration of Depressive symptoms:  Duration of Depressive Symptoms: Greater than two weeks   Mania:   Change in energy/activity;  Euphoria; Increased Energy; Overconfidence; Racing thoughts; Recklessness; Irritability   Anxiety:    Sleep; Restlessness; Tension; Difficulty concentrating; Worrying   Psychosis:   Delusions   Duration of Psychotic symptoms:    Trauma:   None; N/A   Obsessions:   Cause anxiety; Poor insight; Disrupts routine/functioning; Intrusive/time consuming; Recurrent & persistent thoughts/impulses/images   Compulsions:   Disrupts with routine/functioning; Intrusive/time consuming; Not connected to stressor; Poor Insight; Repeated behaviors/mental acts   Inattention:   Disorganized; Avoids/dislikes  activities that require focus; Does not seem to listen; Fails to pay attention/makes careless mistakes; Poor follow-through on tasks   Hyperactivity/Impulsivity:   Difficulty waiting turn; Feeling of restlessness   Oppositional/Defiant Behaviors:   Aggression towards people/animals; Easily annoyed; Resentful   Emotional Irregularity:   Frantic efforts to avoid abandonment; Intense/inappropriate anger; Intense/unstable relationships; Mood lability; Potentially harmful impulsivity; Recurrent suicidal behaviors/gestures/threats   Other Mood/Personality Symptoms:  No data recorded   Mental Status Exam Appearance and self-care  Stature:   Average   Weight:   Average weight   Clothing:   Careless/inappropriate   Grooming:   Normal   Cosmetic use:   Age appropriate   Posture/gait:   Normal   Motor activity:   Restless   Sensorium  Attention:   Confused   Concentration:   Normal   Orientation:   X5   Recall/memory:   Normal   Affect and Mood  Affect:   Appropriate   Mood:   Anxious   Relating  Eye contact:   Normal   Facial expression:   Anxious   Attitude toward examiner:   Cooperative   Thought and Language  Speech flow:  Flight of Ideas   Thought content:   Personalizations; Persecutions; Suspicious   Preoccupation:   Ruminations   Hallucinations:   None   Organization:  No data recorded  Affiliated [employer] of Knowledge:   Average   Intelligence:   Average   Abstraction:   Normal   Judgement:   Normal   Reality Testing:   Distorted   Insight:   None/zero insight   Decision Making:   Normal   Social Functioning  Social Maturity:   Impulsive   Social Judgement:   Normal   Stress  Stressors:   Other (Comment)   Coping Ability:   Normal   Skill Deficits:   Activities of daily living   Supports:   Family     Religion: Religion/Spirituality Are You A Religious Person?:   (unknown)  Leisure/Recreation: Leisure / Recreation Do You Have Hobbies?:  (unknown)  Exercise/Diet: Exercise/Diet Do You Exercise?:  (unknown) Have You Gained or Lost A Significant Amount of Weight in the Past Six Months?: No Do You Follow a Special Diet?: No Do You Have Any Trouble Sleeping?: Yes Explanation of Sleeping Difficulties: patient reports not sleeping for days   CCA Employment/Education Employment/Work Situation: Employment / Work Situation 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: Yes Has Patient ever Been in Equities trader?: Yes (Describe in comment) Field seismologist)  Education: Education Last Grade Completed:  (unk) Did You Product manager?:  (unknown) Did You Have An Individualized Education Program (IIEP): No Did You Have Any Difficulty At School?: No   CCA Family/Childhood History Family and Relationship History: Family history Does patient have children?: Yes How is patient's relationship with their children?: Per spouse....children are afraid of patien when he displays mood unastability  Childhood History:  Childhood History By whom was/is the patient raised?: Both  parents Did patient suffer any verbal/emotional/physical/sexual abuse as a child?: No (unknown) Did patient suffer from severe childhood neglect?: No Has patient ever been sexually abused/assaulted/raped as an adolescent or adult?: No (unknown) Was the patient ever a victim of a crime or a disaster?: No Witnessed domestic violence?: No (unknown) Has patient been affected by domestic violence as an adult?: No (unknown)  Child/Adolescent Assessment:     CCA Substance Use Alcohol/Drug Use: Alcohol / Drug Use Pain Medications: SEE MAR Prescriptions: SEE MAR Over the Counter: SEE MAR History of alcohol / drug use?: No history of alcohol / drug abuse (Denied drugs alcohol)                         ASAM's:  Six Dimensions of Multidimensional  Assessment  Dimension 1:  Acute Intoxication and/or Withdrawal Potential:      Dimension 2:  Biomedical Conditions and Complications:      Dimension 3:  Emotional, Behavioral, or Cognitive Conditions and Complications:     Dimension 4:  Readiness to Change:     Dimension 5:  Relapse, Continued use, or Continued Problem Potential:     Dimension 6:  Recovery/Living Environment:     ASAM Severity Score:    ASAM Recommended Level of Treatment:     Substance use Disorder (SUD)    Recommendations for Services/Supports/Treatments: Recommendations for Services/Supports/Treatments Recommendations For Services/Supports/Treatments: Inpatient Hospitalization  Discharge Disposition:    DSM5 Diagnoses: Patient Active Problem List   Diagnosis Date Noted   PTSD (post-traumatic stress disorder) 04/30/2021   OCD (obsessive compulsive disorder) 02/22/2021   Bipolar I disorder, current or most recent episode manic, severe (HCC) 02/17/2021   Anxiety disorder, unspecified 02/17/2021     Referrals to Alternative Service(s): Referred to Alternative Service(s):   Place:   Date:   Time:    Referred to Alternative Service(s):   Place:   Date:   Time:    Referred to Alternative Service(s):   Place:   Date:   Time:    Referred to Alternative Service(s):   Place:   Date:   Time:     Alfredia Ferguson, LCAS

## 2021-05-01 NOTE — H&P (Signed)
Behavioral Health Medical Screening Exam    Total Time spent with patient: 45 minutes  Andrew Kerr is a 45 y.o male, seen by this provider with TTS counselor with history of depression, anxiety, bipolar, PTSD, presents to [facility] as voluntary walk-in accompanied by no one.  Patient reports he was seen here yesterday, sent to the emergency room for medical clearance, was in the hallway for quite some time did not feel he was getting the care he needed and he left.  Today patient reports "my wife feels like I am going through a manic phase "patient reports that he does not think his medications are working.  Patient is alert and oriented to person place time and situation, has minimal eye contact throughout interview.  He is sitting calmly during interview in the exam room and he is cooperative.  Speech slow, volume normal, denies delusions and paranoia.  Denies suicidal ideation, denies homicidal ideation, denies auditory and visual hallucinations.  Reports that there is no weapons in his home, able to contract for safety.  Denies self injurious behaviors.  Patient is a former [employer], has been in [place], suffers from PTSD.  Denies mood as anxious with congruent affect.  Reports that there has been stress going on at home, they are selling a home.  He takes hydroxyzine for his anxiety.  Reports that his sleep is poor, he gets 3 to 4 hours per night and it is broken.  Appetite normal.  Lives with wife and 9 children in a home.  He is retired from [employer].  Sees a psychologist, Dr. Helmut Muster once per week.  [facility]  Sees a counselor weekly for outpatient therapy.  Reports he takes Lamictal, lithium, Vyvanse, risperidone, hydroxyzine, trazodone, and propanolol.  Reports that he is compliant with his medications.  Denies legal issues/court dates.  Psychiatric Specialty Exam:  Presentation  General Appearance: Appropriate for Environment; Well Groomed  Eye  Contact:Good  Speech:Clear and Coherent; Normal Rate  Speech Volume:Normal  Handedness:Right   Mood and Affect  Mood:Anxious  Affect:Congruent   Thought Process  Thought Processes:Coherent  Descriptions of Associations:Tangential  Orientation:Full (Time, Place and Person)  Thought Content:Tangential (kept focusing on what his wife is saying about him")  History of Schizophrenia/Schizoaffective disorder:No  Duration of Psychotic Symptoms:Less than six months  Hallucinations:Hallucinations: None Ideas of Reference:None (wife reports delusions, patient denies)  Suicidal Thoughts:Suicidal Thoughts: No Homicidal Thoughts:Homicidal Thoughts: No  Sensorium  Memory:Immediate Good; Recent Good; Remote Good  Judgment:Good  Insight:Good   Executive Functions  Concentration:Good  Attention Span:Good  Recall:Good  Fund of Knowledge:Good  Language:Good   Psychomotor Activity  Psychomotor Activity: Psychomotor Activity: Normal  Assets  Assets:Communication Skills; Desire for Improvement; Financial Resources/Insurance; Housing; Intimacy; Physical Health; Social Support; Transportation; Leisure Time   Sleep  Sleep: Sleep: Poor Number of Hours of Sleep: 4   Physical Exam: Physical Exam Constitutional:      Appearance: Normal appearance.  HENT:     Nose: Congestion and rhinorrhea present.  Cardiovascular:     Rate and Rhythm: Normal rate and regular rhythm.     Heart sounds: Normal heart sounds.  Pulmonary:     Effort: Pulmonary effort is normal.     Breath sounds: Normal breath sounds.  Skin:    General: Skin is warm and dry.  Neurological:     Mental Status: He is alert and oriented to person, place, and time.  Psychiatric:  Attention and Perception: Attention normal.        Mood and Affect: Mood is anxious. Affect is flat.        Speech: Speech normal.        Behavior: Behavior normal. Behavior is cooperative.        Thought Content:  Thought content is paranoid (per wife) and delusional (per wife). Thought content does not include homicidal or suicidal ideation. Thought content does not include homicidal or suicidal plan.        Cognition and Memory: Cognition normal.   Review of Systems  Constitutional:  Negative for chills and fever.  Respiratory:  Negative for cough and shortness of breath.   Cardiovascular:  Negative for chest pain.  Gastrointestinal:  Negative for abdominal pain, nausea and vomiting.  Neurological:  Negative for headaches.  Psychiatric/Behavioral:  Negative for depression, hallucinations, substance abuse and suicidal ideas. The patient is nervous/anxious and has insomnia.   Temp: 98.0 oral, 108/66, right arm, pulse 72, respiratory rate: 18, oxygen saturation 99%  sculoskeletal: Strength & Muscle Tone: within normal limits Gait & Station: normal Patient leans: N/A    Recommendations:  Based on my evaluation the patient does not appear to have an emergency medical condition. Recommend inpatient treatment, however, patient is Covid positive and does not wish to go to [facility] for 10 day quarantine period.  Instructed patient if his situation deteriorates, call 911 for safety.  He will quarantine at home, seek benefits through the [place] possibly virtual visit for medication management.  Agreeable to this plan.  Understands to notify 911 if the situation deteriorates and he becomes at risk for self-harm.  Collateral: Spoke with wife, Lyla Son at [phone].  Informed wife that patient is COVID-positive, options include going to the emergency room to await the 10-day quarantine, or discharge home.  Wife reports that she was informed to go ahead and notify the [facility] to see what benefits are available, she reports that she was looking at the [place] portal at the time of this call.  Wife reports that there are no weapons in the home, reports that since patient is COVID-positive he will go to their second home for his  quarantine, there are no weapons in the home as well.  She will continue to seek benefits through the [place], during his 10-day quarantine, safety planning as much as possible, and return to this facility after quarantine if the need continues.  Understands to notify 911 if situation deteriorates and patient is at risk for self-harm.  Novella Olive, NP 05/01/2021, 2:32 PM

## 2021-05-05 ENCOUNTER — Ambulatory Visit (INDEPENDENT_AMBULATORY_CARE_PROVIDER_SITE_OTHER): Admitting: Psychology

## 2021-05-05 DIAGNOSIS — F4312 Post-traumatic stress disorder, chronic: Secondary | ICD-10-CM | POA: Diagnosis not present

## 2021-05-05 DIAGNOSIS — F332 Major depressive disorder, recurrent severe without psychotic features: Secondary | ICD-10-CM

## 2021-05-12 ENCOUNTER — Ambulatory Visit (INDEPENDENT_AMBULATORY_CARE_PROVIDER_SITE_OTHER): Admitting: Psychology

## 2021-05-12 DIAGNOSIS — F332 Major depressive disorder, recurrent severe without psychotic features: Secondary | ICD-10-CM | POA: Diagnosis not present

## 2021-05-12 DIAGNOSIS — F4312 Post-traumatic stress disorder, chronic: Secondary | ICD-10-CM | POA: Diagnosis not present

## 2021-05-15 ENCOUNTER — Other Ambulatory Visit (HOSPITAL_COMMUNITY): Payer: Self-pay | Admitting: Psychiatry

## 2021-05-17 ENCOUNTER — Ambulatory Visit (INDEPENDENT_AMBULATORY_CARE_PROVIDER_SITE_OTHER): Admitting: Psychology

## 2021-05-17 DIAGNOSIS — F332 Major depressive disorder, recurrent severe without psychotic features: Secondary | ICD-10-CM

## 2021-05-17 DIAGNOSIS — F4312 Post-traumatic stress disorder, chronic: Secondary | ICD-10-CM

## 2021-05-18 NOTE — Telephone Encounter (Signed)
Decline at this time- could you check in with the patient whether or not he is planning to see Dr. Lynnae Sandhoff again. There is a chart indicated that he was not pleased with the care at [facility], and transferred his care to [place]. If he continues to see Dr. Lynnae Sandhoff, please make sure to have follow up, and let me know so that I can order refill.

## 2021-05-22 ENCOUNTER — Telehealth (HOSPITAL_COMMUNITY): Admitting: Psychiatry

## 2021-05-26 ENCOUNTER — Ambulatory Visit: Admitting: Psychology

## 2021-05-31 ENCOUNTER — Ambulatory Visit (INDEPENDENT_AMBULATORY_CARE_PROVIDER_SITE_OTHER): Admitting: Psychology

## 2021-05-31 DIAGNOSIS — F332 Major depressive disorder, recurrent severe without psychotic features: Secondary | ICD-10-CM | POA: Diagnosis not present

## 2021-05-31 DIAGNOSIS — F4312 Post-traumatic stress disorder, chronic: Secondary | ICD-10-CM

## 2021-06-11 ENCOUNTER — Other Ambulatory Visit (HOSPITAL_COMMUNITY): Payer: Self-pay | Admitting: Psychiatry

## 2021-06-14 ENCOUNTER — Ambulatory Visit: Admitting: Psychology

## 2021-06-17 ENCOUNTER — Other Ambulatory Visit (HOSPITAL_COMMUNITY): Payer: Self-pay | Admitting: Psychiatry

## 2021-06-17 NOTE — Telephone Encounter (Signed)
Client seen in the ED

## 2021-07-14 ENCOUNTER — Other Ambulatory Visit (HOSPITAL_COMMUNITY): Payer: Self-pay | Admitting: Psychiatry

## 2021-08-04 ENCOUNTER — Ambulatory Visit (INDEPENDENT_AMBULATORY_CARE_PROVIDER_SITE_OTHER): Admitting: Psychology

## 2021-08-04 DIAGNOSIS — F4312 Post-traumatic stress disorder, chronic: Secondary | ICD-10-CM

## 2021-08-04 DIAGNOSIS — F332 Major depressive disorder, recurrent severe without psychotic features: Secondary | ICD-10-CM | POA: Diagnosis not present

## 2021-08-17 ENCOUNTER — Other Ambulatory Visit (HOSPITAL_COMMUNITY): Payer: Self-pay | Admitting: *Deleted

## 2021-08-17 ENCOUNTER — Ambulatory Visit (INDEPENDENT_AMBULATORY_CARE_PROVIDER_SITE_OTHER): Admitting: Psychology

## 2021-08-17 DIAGNOSIS — F332 Major depressive disorder, recurrent severe without psychotic features: Secondary | ICD-10-CM

## 2021-08-17 DIAGNOSIS — F4312 Post-traumatic stress disorder, chronic: Secondary | ICD-10-CM

## 2021-08-17 MED ORDER — RISPERIDONE 1 MG PO TABS: ORAL_TABLET | ORAL | 0 refills | Status: DC

## 2021-08-25 ENCOUNTER — Ambulatory Visit (INDEPENDENT_AMBULATORY_CARE_PROVIDER_SITE_OTHER): Admitting: Psychology

## 2021-08-25 DIAGNOSIS — F332 Major depressive disorder, recurrent severe without psychotic features: Secondary | ICD-10-CM | POA: Diagnosis not present

## 2021-08-25 DIAGNOSIS — F4312 Post-traumatic stress disorder, chronic: Secondary | ICD-10-CM | POA: Diagnosis not present

## 2021-09-07 ENCOUNTER — Ambulatory Visit (INDEPENDENT_AMBULATORY_CARE_PROVIDER_SITE_OTHER): Admitting: Psychology

## 2021-09-07 DIAGNOSIS — F902 Attention-deficit hyperactivity disorder, combined type: Secondary | ICD-10-CM

## 2021-09-07 DIAGNOSIS — F332 Major depressive disorder, recurrent severe without psychotic features: Secondary | ICD-10-CM

## 2021-09-07 DIAGNOSIS — F4312 Post-traumatic stress disorder, chronic: Secondary | ICD-10-CM

## 2021-09-18 ENCOUNTER — Other Ambulatory Visit (HOSPITAL_COMMUNITY): Payer: Self-pay | Admitting: Psychiatry

## 2021-09-22 ENCOUNTER — Ambulatory Visit (INDEPENDENT_AMBULATORY_CARE_PROVIDER_SITE_OTHER): Admitting: Psychology

## 2021-09-22 DIAGNOSIS — F902 Attention-deficit hyperactivity disorder, combined type: Secondary | ICD-10-CM | POA: Diagnosis not present

## 2021-09-22 DIAGNOSIS — F332 Major depressive disorder, recurrent severe without psychotic features: Secondary | ICD-10-CM | POA: Diagnosis not present

## 2021-09-22 DIAGNOSIS — F4312 Post-traumatic stress disorder, chronic: Secondary | ICD-10-CM

## 2021-09-29 ENCOUNTER — Ambulatory Visit: Admitting: Psychology

## 2021-10-26 ENCOUNTER — Ambulatory Visit (INDEPENDENT_AMBULATORY_CARE_PROVIDER_SITE_OTHER): Admitting: Psychology

## 2021-10-26 DIAGNOSIS — F4312 Post-traumatic stress disorder, chronic: Secondary | ICD-10-CM

## 2021-10-26 DIAGNOSIS — F902 Attention-deficit hyperactivity disorder, combined type: Secondary | ICD-10-CM

## 2021-10-26 DIAGNOSIS — F332 Major depressive disorder, recurrent severe without psychotic features: Secondary | ICD-10-CM

## 2021-10-26 NOTE — Progress Notes (Addendum)
Date: 10/26/2021 Appointment Start Time: 12pm Duration: 57 minutes Provider: Helmut Muster, PsyD Type of Session: Individual Therapy  Location of Patient: Home (private location) Location of Provider: Provider's Home (private office) Type of Contact: WebEx video visit with audio  Session Content: Today's appointment was a telepsychological visit due to COVID-19. Andrew Kerr is aware it is his responsibility to secure confidentiality on his end of the session. He provided verbal consent to proceed with today's appointment. Prior to proceeding with today's appointment, Andrew Kerr's physical location at the time of this appointment was obtained as well a phone number he could be reached at in the event of technical difficulties.   This provider conducted a check-in. Andrew Kerr endorsed an irritable mood, discussing relational issues with his wife that has led to him being "close to divorce." This Clinical research associate assisted him in processing his emotional experiences and identifying maladaptive behavioral patterns. He shared patterns of him often taking blame for relational issues, feeling nagged, concerns about a man his wife is texting, and a belief that without her and the children it could lead to him engaging in substance abuse. This Clinical research associate assisted him in formulating plans to address the identified concerns. He stated plans to attend an intensive CBT group, to speak with his psychiatrist, use of grounding and visual reminders, asking for a break if emotionally overwhelmed, and openness to reading "ADHD's Effect on Marriage" with his wife, adding that the current path is not working. This Clinical research associate provided psychoeducation on communication. He denied having any other issues or concerns.   Andrew Kerr was receptive to today's appointment as evidenced by openness to sharing and responsiveness to feedback.  Mental Status Examination: Appearance:  neat Behavior: appropriate to circumstances Mood: irritable Affect: mood  congruent Speech: WNL Eye Contact: appropriate Psychomotor Activity: agitated  Thought Process: linear, logical, and goal directed and denies suicidal, homicidal, and self-harm ideation, plan and intent Content/Perceptual Disturbances: none Orientation: AAOx4 Cognition/Sensorium: intact Insight: fair Judgment: fair  Interventions:  Provided empathic reflections and validation Reviewed content from the previous session Provided positive reinforcement Employed supportive psychotherapy interventions to facilitate reduced distress and to improve coping skills with identified stressors Employed motivational interviewing skills to assess patient's willingness/desire to adhere to recommended medical treatments and assignments Engaged patient in goal setting Engaged patient in problem solving Psychoeducation provided regarding self-care Engaged patient in thought challenging/cognitive restructuring  Introduced behavioral techniques to explore maladaptive behavior conditioning, reinforcement, environmental influences, and extinction  DSM-5 Diagnosis(es):  F43.12 Posttraumatic Stress Disorder, chronic F90.2 Attention-Deficit Hyperactivity Disorder, combined presentation (per patient report) F33.2 Major Depressive Disorder, recurrent, severe with anxious distress  Plan: Andrew Kerr is currently scheduled for a follow-up appointment on 11/09/2020 at 1pm via WebEx video visit with audio. The next session will focus on communication and relaxation techniques.   Goals 1. Alleviate depressive symptoms and return to previous level of effective functioning. Objective Learn and implement conflict resolution skills to resolve interpersonal problems. Target Date: 2022-05-04 Frequency: Daily  Progress: 90 Modality: individual  Related Interventions Teach conflict resolution skills (e.g., empathy, active listening, "I messages," respectful communication, assertiveness without aggression, compromise); use  psychoeducation, modeling, role-playing, and rehearsal to work through several current conflicts; assign homework exercises; review and repeat so as to integrate their use into the client's life. Objective Learn and implement behavioral strategies to overcome depression. Target Date: 2022-05-04 Frequency: Daily  Progress: 70 Modality: individual  Related Interventions Engage the client in "behavioral activation," increasing his/her activity level and contact with sources of reward, while identifying processes  that inhibit activation (see Behavioral Activation for Depression by Katharine Look, Dimidjian, and Herman-Dunn; or assign "Identify and Schedule Pleasant Activities" in the Adult Psychotherapy Homework Planner by [facility]); use behavioral techniques such as instruction, rehearsal, role-playing, role reversal, as needed, to facilitate activity in the client's daily life; reinforce success. 2. Eliminate or reduce the negative impact trauma related symptoms have on social, occupational, and family functioning. Objective Participate in Acceptance and Commitment Therapy (ACT) to reduce the impact of the traumatic event. Target Date: 2022-05-04 Frequency: Daily  Progress: 70 Modality: individual  Related Interventions Use an ACT approach to PTSD to help the client experience and accept the presence of troubling thoughts and images without being overly impacted by them, and committing his/her time and efforts to activities that are consistent with identified, personally meaningful values (see Acceptance and Commitment Therapy for Anxiety Disorders by Mat Carne, and Madilyn Fireman). 3. Reduce overall frequency, intensity, and duration of the anxiety so that daily functioning is not impaired. Objective Learn and implement personal and interpersonal skills to reduce anxiety and improve interpersonal relationships. Target Date: 2022-05-04 Frequency: Daily  Progress: 90 Modality: individual  Related  Interventions Use instruction, modeling, and role-playing to build the client's general social, communication, and/or conflict resolution skills. Objective Identify and engage in pleasant activities on a daily basis. Target Date: 2022-05-04 Frequency: Daily  Progress: 70 Modality: individual  Related Interventions Engage the client in behavioral activation, increasing the client's contact with sources of reward, identifying processes that inhibit activation, and teaching skills to solve life problems (or assign "Identify and Schedule Pleasant Activities" in the Adult Psychotherapy Homework Planner by Jongsma); use behavioral techniques such as instruction, rehearsal, role-playing, role reversal as needed to assist adoption into the client's daily life; reinforce success. Objective Learn and implement problem-solving strategies for realistically addressing worries. Target Date: 2022-05-04 Frequency: Daily  Progress: 80 Modality: individual  Related Interventions Teach the client problem-solving strategies involving specifically defining a problem, generating options for addressing it, evaluating the pros and cons of each option, selecting and implementing an optional action, and reevaluating and refining the action (or assign "Applying Problem-Solving to Interpersonal Conflict" in the Adult Psychotherapy Homework Planner by Stephannie Li). Objective Verbalize an understanding of the role that cognitive biases play in excessive irrational worry and persistent anxiety symptoms. Target Date: 2022-05-04 Frequency: Daily  Progress: 80 Modality: individual  Related Interventions Discuss examples demonstrating that unrealistic worry typically overestimates the probability of threats and underestimates or overlooks the client's ability to manage realistic demands (or assign "Past Successful Anxiety Coping" in the Adult Psychotherapy Homework Planner by [facility]). Objective Learn and implement calming skills to  reduce overall anxiety and manage anxiety symptoms. Target Date: 2022-05-04 Frequency: Daily  Progress: 90 Modality: individual  Related Interventions Teach the client calming/relaxation skills (e.g., applied relaxation, progressive muscle relaxation, cue controlled relaxation; mindful breathing; biofeedback) and how to discriminate better between relaxation and tension; teach the client how to apply these skills to his/her daily life (e.g., New Directions in Progressive Muscle Relaxation by Marcelyn Ditty, and Hazlett-Stevens; Treating Generalized Anxiety Disorder by Rygh and Ida Rogue). Objective Routinely use discussed therapeutic skills Target Date: 2022-05-04 Frequency: Daily  Progress: 0 Modality: individual   The patient was agreeable with the treatment plan, and verbally consented to using it.

## 2021-11-09 ENCOUNTER — Ambulatory Visit (INDEPENDENT_AMBULATORY_CARE_PROVIDER_SITE_OTHER): Admitting: Psychology

## 2021-11-09 DIAGNOSIS — F902 Attention-deficit hyperactivity disorder, combined type: Secondary | ICD-10-CM

## 2021-11-09 DIAGNOSIS — F4312 Post-traumatic stress disorder, chronic: Secondary | ICD-10-CM

## 2021-11-09 DIAGNOSIS — F332 Major depressive disorder, recurrent severe without psychotic features: Secondary | ICD-10-CM | POA: Diagnosis not present

## 2021-11-09 NOTE — Progress Notes (Signed)
Date: 11/09/2021 Appointment Start Time: 1pm Duration: 55 minutes Provider: Helmut Muster, PsyD Type of Session: Individual Therapy  Location of Patient: Home (private location) Location of Provider: Provider's Home (private office) Type of Contact: WebEx video visit with audio  Session Content: Today's appointment was a telepsychological visit due to COVID-19. Yisroel is aware it is his responsibility to secure confidentiality on his end of the session. He provided verbal consent to proceed with today's appointment. Prior to proceeding with today's appointment, Eean's physical location at the time of this appointment was obtained as well a phone number he could be reached at in the event of technical difficulties.   This provider conducted a check-in. Mr. Chesnut endorsed an improved mood, discussing examples of him being quick to catch irritability and apologize, changes in his psychotropic medication, excitement about his plans to fish with a friend, and increasingly spending time out of the home. This Clinical research associate assisted him in processing his emotional experiences as well as reflected and reinforced his efforts. Mr. Friedel described feeling his behavior is restricted by marital and childcare dynamics. This writers assisted him in problem solving the situation and formulating a plan. He concluded with plans to continue working to monitor warning signs of irritability and his tone as well as having a conversation with his wife on possible solutions they both agreeable on his concerns.  Jamaine was receptive to today's appointment as evidenced by openness to sharing and responsiveness to feedback.  Mental Status Examination: Appearance:  neat Behavior: appropriate to circumstances Mood: neutral Affect: mood congruent Speech: pressured Eye Contact: appropriate Psychomotor Activity: restless Thought Process: linear, logical, and goal directed and denies suicidal, homicidal, and self-harm ideation, plan  and intent Content/Perceptual Disturbances: none Orientation: AAOx4 Cognition/Sensorium: intact Insight: good Judgment: good  Interventions:  Provided empathic reflections and validation Reviewed content from the previous session Provided positive reinforcement Employed supportive psychotherapy interventions to facilitate reduced distress and to improve coping skills with identified stressors Employed motivational interviewing skills to assess patient's willingness/desire to adhere to recommended medical treatments and assignments Engaged patient in goal setting Engaged patient in problem solving Psychoeducation provided regarding self-care Explored patient's values  Employed acceptance and commitment interventions to emphasize mindfulness, acceptance without struggle, as well as value guided actions  DSM-5 Diagnosis(es):  F43.12 Posttraumatic Stress Disorder, chronic  F33.2 Major Depressive Disorder, recurrent, severe with anxious distress  F90.2 Attention-Deficit Hyperactivity Disorder, combined presentation (per patient report);  Plan: Imran is currently scheduled for a follow-up appointment on 11/15/2021 at 11am via WebEx video visit with audio. The next session will focus on emotional regulation and value congruent actions.   Goals 1. Alleviate depressive symptoms and return to previous level of effective functioning. Objective Learn and implement conflict resolution skills to resolve interpersonal problems. Target Date: 2022-05-04 Frequency: Daily  Progress: 90 Modality: individual  Related Interventions Teach conflict resolution skills (e.g., empathy, active listening, "I messages," respectful communication, assertiveness without aggression, compromise); use psychoeducation, modeling, role-playing, and rehearsal to work through several current conflicts; assign homework exercises; review and repeat so as to integrate their use into the client's life. Objective Learn and  implement behavioral strategies to overcome depression. Target Date: 2022-05-04 Frequency: Daily  Progress: 70 Modality: individual  Related Interventions Engage the client in "behavioral activation," increasing his/her activity level and contact with sources of reward, while identifying processes that inhibit activation (see Behavioral Activation for Depression by Katharine Look, Dimidjian, and Herman-Dunn; or assign "Identify and Schedule Pleasant Activities" in the Adult Psychotherapy Homework Planner by  Jongsma); use behavioral techniques such as instruction, rehearsal, role-playing, role reversal, as needed, to facilitate activity in the client's daily life; reinforce success. 2. Eliminate or reduce the negative impact trauma related symptoms have on social, occupational, and family functioning. Objective Participate in Acceptance and Commitment Therapy (ACT) to reduce the impact of the traumatic event. Target Date: 2022-05-04 Frequency: Daily  Progress: 70 Modality: individual  Related Interventions Use an ACT approach to PTSD to help the client experience and accept the presence of troubling thoughts and images without being overly impacted by them, and committing his/her time and efforts to activities that are consistent with identified, personally meaningful values (see Acceptance and Commitment Therapy for Anxiety Disorders by Mat Carne, and Madilyn Fireman). 3. Reduce overall frequency, intensity, and duration of the anxiety so that daily functioning is not impaired. Objective Learn and implement personal and interpersonal skills to reduce anxiety and improve interpersonal relationships. Target Date: 2022-05-04 Frequency: Daily  Progress: 90 Modality: individual  Related Interventions Use instruction, modeling, and role-playing to build the client's general social, communication, and/or conflict resolution skills. Objective Identify and engage in pleasant activities on a daily basis. Target  Date: 2022-05-04 Frequency: Daily  Progress: 70 Modality: individual  Related Interventions Engage the client in behavioral activation, increasing the client's contact with sources of reward, identifying processes that inhibit activation, and teaching skills to solve life problems (or assign "Identify and Schedule Pleasant Activities" in the Adult Psychotherapy Homework Planner by Jongsma); use behavioral techniques such as instruction, rehearsal, role-playing, role reversal as needed to assist adoption into the client's daily life; reinforce success. Objective Learn and implement problem-solving strategies for realistically addressing worries. Target Date: 2022-05-04 Frequency: Daily  Progress: 80 Modality: individual  Related Interventions Teach the client problem-solving strategies involving specifically defining a problem, generating options for addressing it, evaluating the pros and cons of each option, selecting and implementing an optional action, and reevaluating and refining the action (or assign "Applying Problem-Solving to Interpersonal Conflict" in the Adult Psychotherapy Homework Planner by Stephannie Li). Objective Verbalize an understanding of the role that cognitive biases play in excessive irrational worry and persistent anxiety symptoms. Target Date: 2022-05-04 Frequency: Daily  Progress: 80 Modality: individual  Related Interventions Discuss examples demonstrating that unrealistic worry typically overestimates the probability of threats and underestimates or overlooks the client's ability to manage realistic demands (or assign "Past Successful Anxiety Coping" in the Adult Psychotherapy Homework Planner by [facility]). Objective Learn and implement calming skills to reduce overall anxiety and manage anxiety symptoms. Target Date: 2022-05-04 Frequency: Daily  Progress: 90 Modality: individual  Related Interventions Teach the client calming/relaxation skills (e.g., applied relaxation,  progressive muscle relaxation, cue controlled relaxation; mindful breathing; biofeedback) and how to discriminate better between relaxation and tension; teach the client how to apply these skills to his/her daily life (e.g., New Directions in Progressive Muscle Relaxation by Marcelyn Ditty, and Hazlett-Stevens; Treating Generalized Anxiety Disorder by Rygh and Ida Rogue). Objective Routinely use discussed therapeutic skills Target Date: 2022-05-04 Frequency: Daily  Progress: 0 Modality: individual              Margarite Gouge, PsyD

## 2021-11-15 ENCOUNTER — Ambulatory Visit: Admitting: Psychology

## 2021-11-15 NOTE — Progress Notes (Signed)
° °  Encounter created in error. Front desk noted patient was unable to attend appointment due to a hospitalization.              Margarite Gouge, PsyD

## 2021-12-12 ENCOUNTER — Ambulatory Visit (HOSPITAL_COMMUNITY)
Admission: AD | Admit: 2021-12-12 | Discharge: 2021-12-12 | Disposition: A | Discharge status: Home / Self Care | Attending: Psychiatry | Admitting: Psychiatry
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
## 2021-12-13 ENCOUNTER — Emergency Department (HOSPITAL_COMMUNITY)

## 2021-12-13 ENCOUNTER — Emergency Department (HOSPITAL_COMMUNITY)
Admission: EM | Admit: 2021-12-13 | Discharge: 2021-12-14 | Disposition: A | Discharge status: Other Acute Inpatient Hospital | Attending: Emergency Medicine | Admitting: Emergency Medicine

## 2021-12-13 ENCOUNTER — Other Ambulatory Visit: Payer: Self-pay

## 2021-12-13 ENCOUNTER — Encounter (HOSPITAL_COMMUNITY): Payer: Self-pay

## 2021-12-13 DIAGNOSIS — F319 Bipolar disorder, unspecified: Secondary | ICD-10-CM | POA: Diagnosis not present

## 2021-12-13 DIAGNOSIS — S71131A Puncture wound without foreign body, right thigh, initial encounter: Secondary | ICD-10-CM | POA: Diagnosis not present

## 2021-12-13 DIAGNOSIS — Z7289 Other problems related to lifestyle: Secondary | ICD-10-CM

## 2021-12-13 DIAGNOSIS — Z79899 Other long term (current) drug therapy: Secondary | ICD-10-CM | POA: Diagnosis not present

## 2021-12-13 DIAGNOSIS — S79921A Unspecified injury of right thigh, initial encounter: Secondary | ICD-10-CM | POA: Diagnosis present

## 2021-12-13 DIAGNOSIS — T148XXA Other injury of unspecified body region, initial encounter: Secondary | ICD-10-CM

## 2021-12-13 DIAGNOSIS — F39 Unspecified mood [affective] disorder: Secondary | ICD-10-CM

## 2021-12-13 DIAGNOSIS — X789XXA Intentional self-harm by unspecified sharp object, initial encounter: Secondary | ICD-10-CM | POA: Diagnosis not present

## 2021-12-13 DIAGNOSIS — Z20822 Contact with and (suspected) exposure to covid-19: Secondary | ICD-10-CM | POA: Diagnosis not present

## 2021-12-13 LAB — RAPID URINE DRUG SCREEN, HOSP PERFORMED
Amphetamines: POSITIVE — AB
Barbiturates: NOT DETECTED
Benzodiazepines: NOT DETECTED
Cocaine: NOT DETECTED
Opiates: NOT DETECTED
Tetrahydrocannabinol: POSITIVE — AB

## 2021-12-13 LAB — LITHIUM LEVEL: Lithium Lvl: 0.39 mmol/L — ABNORMAL LOW (ref 0.60–1.20)

## 2021-12-13 LAB — CBC WITH DIFFERENTIAL/PLATELET
Abs Immature Granulocytes: 0.01 10*3/uL (ref 0.00–0.07)
Basophils Absolute: 0 10*3/uL (ref 0.0–0.1)
Basophils Relative: 0 %
Eosinophils Absolute: 0.1 10*3/uL (ref 0.0–0.5)
Eosinophils Relative: 2 %
HCT: 42.5 % (ref 39.0–52.0)
Hemoglobin: 14.5 g/dL (ref 13.0–17.0)
Immature Granulocytes: 0 %
Lymphocytes Relative: 34 %
Lymphs Abs: 2 10*3/uL (ref 0.7–4.0)
MCH: 32.3 pg (ref 26.0–34.0)
MCHC: 34.1 g/dL (ref 30.0–36.0)
MCV: 94.7 fL (ref 80.0–100.0)
Monocytes Absolute: 0.6 10*3/uL (ref 0.1–1.0)
Monocytes Relative: 10 %
Neutro Abs: 3.2 10*3/uL (ref 1.7–7.7)
Neutrophils Relative %: 54 %
Platelets: 301 10*3/uL (ref 150–400)
RBC: 4.49 MIL/uL (ref 4.22–5.81)
RDW: 13.2 % (ref 11.5–15.5)
WBC: 6 10*3/uL (ref 4.0–10.5)
nRBC: 0 % (ref 0.0–0.2)

## 2021-12-13 LAB — COMPREHENSIVE METABOLIC PANEL
ALT: 31 U/L (ref 0–44)
AST: 20 U/L (ref 15–41)
Albumin: 4.5 g/dL (ref 3.5–5.0)
Alkaline Phosphatase: 62 U/L (ref 38–126)
Anion gap: 9 (ref 5–15)
BUN: 13 mg/dL (ref 6–20)
CO2: 28 mmol/L (ref 22–32)
Calcium: 9.1 mg/dL (ref 8.9–10.3)
Chloride: 102 mmol/L (ref 98–111)
Creatinine, Ser: 1.28 mg/dL — ABNORMAL HIGH (ref 0.61–1.24)
GFR, Estimated: >60 mL/min (ref >60)
Glucose, Bld: 104 mg/dL — ABNORMAL HIGH (ref 70–99)
Potassium: 3.5 mmol/L (ref 3.5–5.1)
Sodium: 139 mmol/L (ref 135–145)
Total Bilirubin: 0.7 mg/dL (ref 0.3–1.2)
Total Protein: 8 g/dL (ref 6.5–8.1)

## 2021-12-13 LAB — ETHANOL: Alcohol, Ethyl (B): <10 mg/dL (ref <10)

## 2021-12-13 LAB — SALICYLATE LEVEL: Salicylate Lvl: <7 mg/dL — ABNORMAL LOW (ref 7.0–30.0)

## 2021-12-13 LAB — ACETAMINOPHEN LEVEL: Acetaminophen (Tylenol), Serum: <10 ug/mL — ABNORMAL LOW (ref 10–30)

## 2021-12-13 MED ORDER — PROPRANOLOL HCL 40 MG PO TABS
40.0000 mg | ORAL_TABLET | Freq: Two times a day (BID) | ORAL | Status: DC
  Administered 2021-12-13 – 2021-12-14 (×3): 40 mg via ORAL
  Filled 2021-12-13 (×3): qty 1

## 2021-12-13 MED ORDER — LAMOTRIGINE 100 MG PO TABS
200.0000 mg | ORAL_TABLET | Freq: Two times a day (BID) | ORAL | Status: DC
  Administered 2021-12-13 – 2021-12-14 (×3): 200 mg via ORAL
  Filled 2021-12-13 (×3): qty 2

## 2021-12-13 MED ORDER — RISPERIDONE 0.5 MG PO TABS
0.5000 mg | ORAL_TABLET | Freq: Two times a day (BID) | ORAL | Status: DC
  Administered 2021-12-13 – 2021-12-14 (×3): 0.5 mg via ORAL
  Filled 2021-12-13 (×3): qty 1

## 2021-12-13 MED ORDER — LITHIUM CARBONATE 300 MG PO CAPS
450.0000 mg | ORAL_CAPSULE | Freq: Two times a day (BID) | ORAL | Status: DC
  Administered 2021-12-13 – 2021-12-14 (×3): 450 mg via ORAL
  Filled 2021-12-13 (×3): qty 1

## 2021-12-13 MED ORDER — LORAZEPAM 1 MG PO TABS
2.0000 mg | ORAL_TABLET | Freq: Once | ORAL | Status: AC
  Administered 2021-12-13: 2 mg via ORAL
  Filled 2021-12-13: qty 2

## 2021-12-13 MED ORDER — BACITRACIN ZINC 500 UNIT/GM EX OINT
TOPICAL_OINTMENT | Freq: Two times a day (BID) | CUTANEOUS | Status: DC
  Filled 2021-12-13 (×2): qty 0.9

## 2021-12-13 MED ORDER — IBUPROFEN 400 MG PO TABS
400.0000 mg | ORAL_TABLET | Freq: Once | ORAL | Status: AC
  Administered 2021-12-13: 400 mg via ORAL
  Filled 2021-12-13: qty 1

## 2021-12-13 MED ORDER — HYDROXYZINE HCL 25 MG PO TABS
25.0000 mg | ORAL_TABLET | Freq: Four times a day (QID) | ORAL | Status: DC | PRN
  Administered 2021-12-13 – 2021-12-14 (×2): 25 mg via ORAL
  Filled 2021-12-13 (×2): qty 1

## 2021-12-13 NOTE — Progress Notes (Addendum)
CSW called the [facility] and spoke with AOD who informed CSW that there is available behavioral health placement within their facility and to fax referral to [phone]. CSW will start the process of completing the referral and first shift to follow up.   Maryjean Ka, MSW, LCSWA 12/13/2021 11:00 PM

## 2021-12-13 NOTE — ED Notes (Signed)
ED Provider at bedside. 

## 2021-12-13 NOTE — ED Notes (Addendum)
Sent secure chat message to Karle Starch in regards to patient update on TTS.

## 2021-12-13 NOTE — ED Notes (Signed)
Lyla Son wife [phone] requesting to speak to the patient

## 2021-12-13 NOTE — ED Notes (Signed)
Breakfast ordered 

## 2021-12-13 NOTE — ED Notes (Signed)
Bacitracin and new non stick gauze applied to patient's upper right thigh. Patient tolerated well with no complaints.

## 2021-12-13 NOTE — ED Notes (Signed)
Security called at this time to wand patient and patient's belongings.

## 2021-12-13 NOTE — Progress Notes (Signed)
CSW contacted [facility] but was unable to speak with the transfer coordinator April but left a message to have her return the call at [phone] extension [phone]. CSW is awaiting to hear back.    Damita Dunnings, MSW, LCSW-A  11:35 AM 12/13/2021

## 2021-12-13 NOTE — ED Notes (Signed)
Per RT patient is ok to wear his own cpap machine that he has brought from home.

## 2021-12-13 NOTE — ED Provider Notes (Signed)
[name]   CSN: [id number] Arrival date & time: 12/13/21  0022     History  Chief Complaint  Patient presents with   Puncture Wound   Depression    Patient states that around 8 pm his depression and anxiety "got the best of him" and he decided to stab himself in his right upper thigh with a screw driver. Patient states that at this time he does not have SI/HI and is compliant on all of his medications. Eight puncture marks noted to upper right thigh, no bleeding at this time but ecchymosis noted around puncture site.      Andrew Kerr is a 46 y.o. male.  The history is provided by the patient and medical records.  Depression Andrew Kerr is a 46 y.o. male who presents to the Emergency Department complaining of bipolar disorder.  Patient presents to the emergency department voluntarily upon referral from behavioral health for uncontrolled bipolar disorder.  He states that today around 8 PM he took a screwdriver and began striking his right thigh.  He states that this was not a suicidal intent but feels like his bipolar is uncontrolled.  He is scheduled to be admitted to a 30-day program in [place] on Monday.  Last tetanus was 2 years ago.  He is compliant with home medications.  He is able to walk without difficulty.     Home Medications Prior to Admission medications   Medication Sig Start Date End Date Taking? Authorizing Provider  hydrOXYzine (VISTARIL) 25 MG capsule Take 25 mg by mouth 4 (four) times daily as needed. 07/04/21  Yes [provider]  busPIRone (BUSPAR) 7.5 MG tablet Take 1 tablet (7.5 mg total) by mouth daily. Patient not taking: Reported on 12/13/2021 04/17/21 04/17/22  Thresa Ross, MD  lamoTRIgine (LAMICTAL) 200 MG tablet TAKE 1 TABLET(200 MG) BY MOUTH TWICE DAILY 04/23/21   Charm Rings, NP  lithium carbonate 150 MG capsule Take 3 capsules (450 mg total) by mouth 2 (two) times daily with a  meal. Patient taking differently: Take 450 mg by mouth 2 (two) times daily. 04/17/21   Thresa Ross, MD  propranolol (INDERAL) 20 MG tablet Take 40 mg by mouth 2 (two) times daily. 01/25/21   [provider]  risperiDONE (RISPERDAL) 1 MG tablet TAKE 1 TABLET BY MOUTH EVERY MORNING, AND 1 AND 1/2 TABLET AT BEDTIME Patient taking differently: Take 0.5 mg by mouth 2 (two) times daily. 08/17/21   Thresa Ross, MD  traZODone (DESYREL) 50 MG tablet TAKE 1 TABLET(50 MG) BY MOUTH AT BEDTIME AS NEEDED FOR SLEEP Patient not taking: Reported on 12/13/2021 04/23/21   Charm Rings, NP      Allergies    Zoloft Nolene Bernheim hcl] and Abilify [aripiprazole]    Review of Systems   Review of Systems  Psychiatric/Behavioral:  Positive for depression.   All other systems reviewed and are negative.  Physical Exam Updated Vital Signs BP (!) 127/91 (BP Location: Right Arm)    Pulse 67    Temp 97.9 F (36.6 C) (Oral)    Resp 16    Ht 6' (1.829 m)    Wt 97.5 kg    SpO2 100%    BMI 29.16 kg/m  Physical Exam Vitals and nursing note reviewed.  Constitutional:      Appearance: He is well-developed.  HENT:     Head: Normocephalic and atraumatic.  Cardiovascular:     Rate and Rhythm: Normal rate and  regular rhythm.  Pulmonary:     Effort: Pulmonary effort is normal. No respiratory distress.  Musculoskeletal:        General: No tenderness.     Comments: The right distal lateral thigh has multiple eschars with local mild swelling and ecchymosis.  No significant soft tissue tenderness to palpation.  2+ DP pulses bilaterally.  Flexion extension intact at the knee.  Patient can ambulate without difficulty.  Skin:    General: Skin is warm and dry.  Neurological:     Mental Status: He is alert and oriented to person, place, and time.  Psychiatric:     Comments: Appears anxious.  No active SI, HI.    ED Results / Procedures / Treatments   Labs (all labs ordered are listed, but only abnormal results are  displayed) Labs Reviewed  COMPREHENSIVE METABOLIC PANEL  ETHANOL  CBC WITH DIFFERENTIAL/PLATELET  RAPID URINE DRUG SCREEN, HOSP PERFORMED  SALICYLATE LEVEL  ACETAMINOPHEN LEVEL  LITHIUM LEVEL    EKG None  Radiology DG Femur Min 2 Views Right  Result Date: 12/13/2021 CLINICAL DATA:  Stabbing injury right thigh with a screwdriver. EXAM: RIGHT FEMUR 2 VIEWS COMPARISON:  None. FINDINGS: There is no evidence of fracture or other focal bone lesions. Soft tissues are unremarkable. IMPRESSION: No acute osseous findings or focal visible soft tissue abnormality. Electronically Signed   By: Almira Bar M.D.   On: 12/13/2021 01:43    Procedures Procedures    Medications Ordered in ED Medications  bacitracin ointment ( Topical Given 12/13/21 0158)  hydrOXYzine (VISTARIL) capsule 25 mg (has no administration in time range)  risperiDONE (RISPERDAL) tablet 0.5 mg (has no administration in time range)  propranolol (INDERAL) tablet 40 mg (has no administration in time range)  lamoTRIgine (LAMICTAL) tablet 200 mg (has no administration in time range)  lithium carbonate capsule 450 mg (has no administration in time range)  LORazepam (ATIVAN) tablet 2 mg (2 mg Oral Given 12/13/21 0136)    ED Course/ Medical Decision Making/ A&P                           Medical Decision Making Amount and/or Complexity of Data Reviewed Labs: ordered. Radiology: ordered.  Risk OTC drugs. Prescription drug management.  Patient referred to the emergency department for medical clearance, has bipolar disorder on multiple medications, recently had self-inflicted puncture wounds to the right thigh from a screwdriver.  In terms of his injuries these appear to be relatively superficial.  Wounds were cleaned with chlorhexidine, water.  We will apply bacitracin ointment given his recent soft tissue infection of his knee 2 weeks ago.  No evidence of foreign body or deep tissue space injury.  Wounds are hemostatic.  In  terms of wound care recommend bacitracin ointment, wash with soap and water daily.  Patient has been medically cleared for psychiatric evaluation and treatment.         Final Clinical Impression(s) / ED Diagnoses Final diagnoses:  None    Rx / DC Orders ED Discharge Orders     None         Tilden Fossa, MD 12/13/21 [phone]

## 2021-12-13 NOTE — BH Assessment (Signed)
Comprehensive Clinical Assessment (CCA) Note  12/13/2021 Andrew Kerr [id number]  Disposition: TTS completed. Patient to referred to [facility] for medical clearance, per [facility] provider Andrew Kerr, [place]). Patient also meeting inpatient treatment. Disposition Counselor to seek appropriate placement.   Chief Complaint:  Chief Complaint  Patient presents with   Psychiatric Evaluation   Visit Diagnosis: Bipolar I disorder, current or most recent episode manic, severe     Patient is a 46 year old male that presents as a voluntary walk in to [facility]. Patient denies any S/I,H/I or AVH. However, shows this Clinician self inflicted wounds to his right legs. States that he used a screw driver for self injurious behaviors "because I was mad".Patient presented to [facility] as a walk in, patient with complaints of "I'm having mental health crises", "I'm not sleeping", "I'm irritable", "I've been like this for 2 weeks".  Patient has a history of Bipolar I, generalized anxiety disorder, ADHD, PTSD and adjustment disorder with mixed disturbance of emotions and conduct per chart review. Patient states he contnues to have ongoing feelings of worthlessness, intrusive thoughts and episodes of mania the last week with decreased sleep reporting 3 hours or less a night due to medications needing to be adjusted. Patient also has been experiencing anger management issues and paranoia in reference to his wife having a affair.    Patient is a veteran who has been receiving OP services from the [place] in [place] but has been dissatisfied with those services and recently switched providers now a psychiatrist at at he [place] who assists with medication management for ongoing symptoms. Also, a psychologist, Andrew Kerr with [facility].  Patient reports the transition between providers had "messed up his medications" that he states is the cause of previous symptoms. Patient stating that his medications are now correct and he doesn't want our staff to  make any changes to medications.   See MAR in reference to medication management. Patient this date presents as anxious, stressed, circumstantial and finds it difficult to answer questions at times, staying on topic, and answering questions appropriately w/o being easily distracted to discuss other subjects,  stating it is "due to not sleeping for days." Patient contnues to voice ongoing paranoia and is concerned that his wife is cheating on him.   During evaluation Andrew Kerr is in sitting position in no acute distress.  He  is alert, oriented x 4 and  cooperative. He reports his mood as"stressed". He is anxious. He is anxious.  His affect is congruent.  Patient is is illogical at times.. He has intermittent delusional thoughts of his wife cheating on him. He is paranoid that he will not be able to be admitted to   His thoughts are tangential. Difficult to redirect in conversation. Endorses racing thoughts.  Reports he has not eaten or slept in days.  Objectively patient does not appear to be responding to any internal stimuli. Denies suicidal ideations, but states "everyone would be better off with out me". Denies auditory/visual hallucinations.    [facility] [place] [facility] with Andrew Kerr Psychologist.   CCA Screening, Triage and Referral (STR)  Patient Reported Information How did you hear about [place]? Self  What Is the Reason for Your Visit/Call Today? "I have mental health issues", Ongoing mania, problems sleeping, racing thoughts  How Long Has This Been Causing You Problems? 1 wk - 1 month  What Do You Feel Would Help You the Most Today? Medication(s); Treatment for Depression or other  mood problem   Have You Recently Had Any Thoughts About Hurting Yourself? No  Are You Planning to Commit Suicide/Harm Yourself At This time? No   Have you Recently Had Thoughts About Hurting Someone Karolee Ohslse? No  Are You Planning to Harm Someone at This Time?  No  Explanation: No data recorded  Have You Used Any Alcohol or Drugs in the Past 24 Hours? No  How Long Ago Did You Use Drugs or Alcohol? No data recorded What Did You Use and How Much? No data recorded  Do You Currently Have a Therapist/Psychiatrist? Yes  Name of Therapist/Psychiatrist[facility]   Have You Been Recently Discharged From Any Office Practice or Programs? No  Explanation of Discharge From Practice/Program: No data recorded    CCA Screening Triage Referral Assessment Type of Contact: Face-to-Face  Telemedicine Service Delivery:   Is this Initial or Reassessment? No data recorded Date Telepsych consult ordered in [facility]:  No data recorded Time Telepsych consult ordered in [facility]:  No data recorded [facility]  [facility]   Collateral Involvement: The provider Andrew Kerr(Andrew Kerr, [place]), patient spoke to patient's wife Andrew Kerr [phone]   Does Patient Have a Court Appointed Legal Guardian? No data recorded Name and Contact of Legal Guardian: No data recorded If Minor and Not Living with Parent(s), Who has Custody? NA  Is CPS involved or ever been involved? Never  Is APS involved or ever been involved? Never   Patient Determined To Be At Risk for Harm To Self or Others Based on Review of Patient Reported Information or Presenting Complaint? No  Method: No data recorded Availability of Means: No data recorded Intent: No data recorded Notification Required: No data recorded Additional Information for Danger to Others Potential: No data recorded Additional Comments for Danger to Others Potential: No data recorded Are There Guns or Other Weapons in Your Home? No data recorded Types of Guns/Weapons: No data recorded Are These Weapons Safely Secured?                            No data recorded Who Could Verify You  Are Able To Have These Secured: No data recorded Do You Have any Outstanding Charges, Pending Court Dates, Parole/Probation? No data recorded Contacted To Inform of Risk of Harm To Self or Others: Other: Comment (NA)    Does Patient Present under Involuntary Commitment? No  IVC Papers Initial File Date: No data recorded  [place]   Patient Currently Receiving the Following Services: Individual Therapy; Medication Management   Determination of Need: Emergent (2 hours)   Options For Referral: Medication Management; Inpatient Hospitalization     CCA Biopsychosocial Patient Reported Schizophrenia/Schizoaffective Diagnosis in Past: No   Strengths: unknown   Mental Health Symptoms Depression:   Difficulty Concentrating; Hopelessness; Fatigue; Change in energy/activity; Increase/decrease in appetite; Irritability; Sleep (too much or little); Tearfulness; Worthlessness; Weight gain/loss   Duration of Depressive symptoms:  Duration of Depressive Symptoms: Greater than two weeks   Mania:   Change in energy/activity; Euphoria; Increased Energy; Overconfidence; Racing thoughts; Recklessness; Irritability   Anxiety:    Sleep; Restlessness; Tension; Difficulty concentrating; Worrying   Psychosis:   Delusions   Duration of Psychotic symptoms:  Duration of Psychotic Symptoms: Less than six months   Trauma:   None; N/A   Obsessions:   Cause anxiety; Poor insight;  Disrupts routine/functioning; Intrusive/time consuming; Recurrent & persistent thoughts/impulses/images   Compulsions:   Disrupts with routine/functioning; Intrusive/time consuming; Not connected to stressor; Poor Insight; Repeated behaviors/mental acts   Inattention:   Disorganized; Avoids/dislikes activities that require focus; Does not seem to listen; Fails to pay attention/makes careless mistakes; Poor follow-through on tasks   Hyperactivity/Impulsivity:   Difficulty waiting turn;  Feeling of restlessness   Oppositional/Defiant Behaviors:   Aggression towards people/animals; Easily annoyed; Resentful   Emotional Irregularity:   Frantic efforts to avoid abandonment; Intense/inappropriate anger; Intense/unstable relationships; Mood lability; Potentially harmful impulsivity; Recurrent suicidal behaviors/gestures/threats   Other Mood/Personality Symptoms:  No data recorded   Mental Status Exam Appearance and self-care  Stature:   Average   Weight:   Average weight   Clothing:   Careless/inappropriate   Grooming:   Normal   Cosmetic use:   None   Posture/gait:   Normal   Motor activity:   Restless   Sensorium  Attention:   Confused   Concentration:   Normal   Orientation:   X5   Recall/memory:   Normal   Affect and Mood  Affect:   Appropriate   Mood:   Anxious   Relating  Eye contact:   Normal   Facial expression:   Anxious   Attitude toward examiner:   Cooperative   Thought and Language  Speech flow:  Flight of Ideas   Thought content:   Personalizations; Persecutions; Suspicious   Preoccupation:   Ruminations   Hallucinations:   None   Organization:  No data recorded  [employer] of Knowledge:   Average   Intelligence:   Average   Abstraction:   Overly abstract   Judgement:   Normal   Reality Testing:   Distorted   Insight:   None/zero insight   Decision Making:   Normal   Social Functioning  Social Maturity:   Impulsive   Social Judgement:   Normal   Stress  Stressors:   Other (Comment)   Coping Ability:   Normal   Skill Deficits:   Activities of daily living   Supports:   Family     Religion: Religion/Spirituality Are You A Religious Person?: No  Leisure/Recreation: Leisure / Recreation Do You Have Hobbies?: No  Exercise/Diet: Exercise/Diet Do You Exercise?: No Have You Gained or Lost A Significant Amount of Weight in the Past Six Months?: No Do You  Follow a Special Diet?: No Do You Have Any Trouble Sleeping?: No   CCA Employment/Education Employment/Work Situation: Employment / Work Psychologist, occupational 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?: Yes (Describe in comment) Did You Receive Any Psychiatric Treatment/Services While in the Military?: No  Education: Education Is Patient Currently Attending School?: No Did You Product manager?: No Did You Have An Individualized Education Program (IIEP): No Did You Have Any Difficulty At School?: No Patient's Education Has Been Impacted by Current Illness: No   CCA Family/Childhood History Family and Relationship History: Family history Marital status: Single Does patient have children?: Yes How many children?: 9 How is patient's relationship with their children?: Per spouse....children are afraid of patien when he displays mood unastability  Childhood History:  Childhood History Did patient suffer any verbal/emotional/physical/sexual abuse as a child?: No Did patient suffer from severe childhood neglect?: No  Has patient ever been sexually abused/assaulted/raped as an adolescent or adult?: No Was the patient ever a victim of a crime or a disaster?: No Witnessed domestic violence?: No Has patient been affected by domestic violence as an adult?: No  Child/Adolescent Assessment:     CCA Substance Use Alcohol/Drug Use: Alcohol / Drug Use Pain Medications: SEE MAR Prescriptions: SEE MAR Over the Counter: SEE MAR History of alcohol / drug use?: No history of alcohol / drug abuse                         ASAM's:  Six Dimensions of Multidimensional Assessment  Dimension 1:  Acute Intoxication and/or Withdrawal  Potential:      Dimension 2:  Biomedical Conditions and Complications:      Dimension 3:  Emotional, Behavioral, or Cognitive Conditions and Complications:     Dimension 4:  Readiness to Change:     Dimension 5:  Relapse, Continued use, or Continued Problem Potential:     Dimension 6:  Recovery/Living Environment:     ASAM Severity Score:    ASAM Recommended Level of Treatment:     Substance use Disorder (SUD)    Recommendations for Services/Supports/Treatments: Recommendations for Services/Supports/Treatments Recommendations For Services/Supports/Treatments: Inpatient Hospitalization, Medication Management  Discharge Disposition:    DSM5 Diagnoses: Patient Active Problem List   Diagnosis Date Noted   PTSD (post-traumatic stress disorder) 04/30/2021   OCD (obsessive compulsive disorder) 02/22/2021   Bipolar I disorder, current or most recent episode manic, severe (HCC) 02/17/2021   Anxiety disorder, unspecified 02/17/2021     Referrals to Alternative Service(s): Referred to Alternative Service(s):   Place:   Date:   Time:    Referred to Alternative Service(s):   Place:   Date:   Time:    Referred to Alternative Service(s):   Place:   Date:   Time:    Referred to Alternative Service(s):   Place:   Date:   Time:     Melynda Ripple, Counselor

## 2021-12-13 NOTE — ED Notes (Signed)
This RN called hospital coverage for sitter for patient. Per them there are no available sitters in the hospital at this time. Will let charge ED know of same.

## 2021-12-13 NOTE — ED Notes (Signed)
Report handed off to [facility].

## 2021-12-13 NOTE — ED Notes (Signed)
This RN applied clean non-stick gauze to patient's right upper thigh.

## 2021-12-14 ENCOUNTER — Encounter (HOSPITAL_COMMUNITY): Payer: Self-pay | Admitting: Student

## 2021-12-14 ENCOUNTER — Inpatient Hospital Stay (HOSPITAL_COMMUNITY)
Admission: AD | Admit: 2021-12-14 | Discharge: 2021-12-18 | DRG: 885 | Disposition: A | Discharge status: Home / Self Care | Source: Intra-hospital | Attending: Emergency Medicine | Admitting: Emergency Medicine

## 2021-12-14 ENCOUNTER — Other Ambulatory Visit: Payer: Self-pay

## 2021-12-14 DIAGNOSIS — Z79899 Other long term (current) drug therapy: Secondary | ICD-10-CM | POA: Diagnosis not present

## 2021-12-14 DIAGNOSIS — F319 Bipolar disorder, unspecified: Secondary | ICD-10-CM | POA: Diagnosis present

## 2021-12-14 DIAGNOSIS — F429 Obsessive-compulsive disorder, unspecified: Secondary | ICD-10-CM | POA: Diagnosis present

## 2021-12-14 DIAGNOSIS — I10 Essential (primary) hypertension: Secondary | ICD-10-CM | POA: Diagnosis present

## 2021-12-14 DIAGNOSIS — F419 Anxiety disorder, unspecified: Secondary | ICD-10-CM | POA: Diagnosis present

## 2021-12-14 DIAGNOSIS — F4310 Post-traumatic stress disorder, unspecified: Secondary | ICD-10-CM | POA: Diagnosis present

## 2021-12-14 DIAGNOSIS — Z9151 Personal history of suicidal behavior: Secondary | ICD-10-CM | POA: Diagnosis not present

## 2021-12-14 DIAGNOSIS — F411 Generalized anxiety disorder: Secondary | ICD-10-CM | POA: Diagnosis present

## 2021-12-14 DIAGNOSIS — E7800 Pure hypercholesterolemia, unspecified: Secondary | ICD-10-CM | POA: Diagnosis present

## 2021-12-14 DIAGNOSIS — S71131A Puncture wound without foreign body, right thigh, initial encounter: Secondary | ICD-10-CM | POA: Diagnosis not present

## 2021-12-14 DIAGNOSIS — G4700 Insomnia, unspecified: Secondary | ICD-10-CM | POA: Diagnosis present

## 2021-12-14 LAB — RESP PANEL BY RT-PCR (FLU A&B, COVID) ARPGX2
Influenza A by PCR: NEGATIVE
Influenza B by PCR: NEGATIVE
SARS Coronavirus 2 by RT PCR: NEGATIVE

## 2021-12-14 MED ORDER — ACETAMINOPHEN 325 MG PO TABS: 650.0000 mg | ORAL_TABLET | Freq: Four times a day (QID) | ORAL | Status: DC | PRN

## 2021-12-14 MED ORDER — PROPRANOLOL HCL 40 MG PO TABS
40.0000 mg | ORAL_TABLET | Freq: Two times a day (BID) | ORAL | Status: DC
  Administered 2021-12-14 – 2021-12-18 (×8): 40 mg via ORAL
  Filled 2021-12-14 (×10): qty 1

## 2021-12-14 MED ORDER — BACITRACIN ZINC 500 UNIT/GM EX OINT
TOPICAL_OINTMENT | Freq: Two times a day (BID) | CUTANEOUS | Status: DC
  Administered 2021-12-14 – 2021-12-17 (×5): 31.5556 via TOPICAL
  Filled 2021-12-14: qty 28.35

## 2021-12-14 MED ORDER — LITHIUM CARBONATE 300 MG PO CAPS
450.0000 mg | ORAL_CAPSULE | Freq: Two times a day (BID) | ORAL | Status: DC
  Administered 2021-12-14 – 2021-12-18 (×8): 450 mg via ORAL
  Filled 2021-12-14 (×10): qty 1

## 2021-12-14 MED ORDER — TEMAZEPAM 15 MG PO CAPS
30.0000 mg | ORAL_CAPSULE | Freq: Once | ORAL | Status: AC
  Administered 2021-12-14: 30 mg via ORAL
  Filled 2021-12-14: qty 2

## 2021-12-14 MED ORDER — HYDROXYZINE HCL 25 MG PO TABS
25.0000 mg | ORAL_TABLET | Freq: Four times a day (QID) | ORAL | Status: DC | PRN
  Administered 2021-12-14 – 2021-12-17 (×2): 25 mg via ORAL
  Filled 2021-12-14 (×2): qty 1

## 2021-12-14 MED ORDER — MAGNESIUM HYDROXIDE 400 MG/5ML PO SUSP: 30.0000 mL | Freq: Every day | ORAL | Status: DC | PRN

## 2021-12-14 MED ORDER — RISPERIDONE 0.5 MG PO TABS
0.5000 mg | ORAL_TABLET | Freq: Two times a day (BID) | ORAL | Status: DC
  Administered 2021-12-14 – 2021-12-17 (×6): 0.5 mg via ORAL
  Filled 2021-12-14 (×8): qty 1

## 2021-12-14 MED ORDER — ALUM & MAG HYDROXIDE-SIMETH 200-200-20 MG/5ML PO SUSP: 30.0000 mL | ORAL | Status: DC | PRN

## 2021-12-14 NOTE — Progress Notes (Signed)
Inpatient Behavioral Health Placement  Pt meets inpatient criteria per Melbourne Abts, PA-C. CSW learned that referral can be sent even though pt has [employer] due to new implementations within [employer] pt does not [facility]. CSW requested that [facility] review referral per [facility] Fransico Michael, RN. Referral was sent to the following facilities;   Destination Service Provider Address Phone Fax  [facility]  [address], [place] [address] [phone]  [facility]  [address] [place] [address] [phone]  [facility] [address]  [address] [place] [address] [phone]  [facility]  [address] [place], [place] [address] [phone]  [facility]  [address] [place] [address] [phone]  [facility]  [address] [place] [address] [phone]  [facility]  [address] [place], [place] [address] [phone]  [facility]  [address] [place] [address] [phone]  [facility]  [address], [place] [address] [phone]  [facility]  [address], [place] [address] [phone]  [facility]  [address], [place] [address] [phone]  [facility]  [address] [place] [address] [phone]  [facility]  [address] [place] [address] [phone]  [facility]  [address], [place] [address] [phone]  [facility]  [address] [place] [address] [phone]  [facility]  [address] [place] [address] [phone]  [facility]  [address], [place] [address] [phone]  [facility]         Situation ongoing,  CSW will follow up.   Maryjean Ka, MSW, LCSWA 12/14/2021  @ 1:12 AM

## 2021-12-14 NOTE — ED Notes (Signed)
Faxed consent to [phone]
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
## 2021-12-14 NOTE — ED Notes (Signed)
All belongings returned to Pt including CPAP.  Pt signed form for valuables sent to Security.  Pt escort out to [employer] by Comptroller.

## 2021-12-14 NOTE — ED Notes (Signed)
Safe Transport called 
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
## 2021-12-14 NOTE — Progress Notes (Signed)
**  Please disregard the previous note for placement.  This patient has been accepted to [facility] by Melbourne Abts. Dr Mason Jim is the attending. Informed pt's nurse via secure chat at [phone]. Patient needs to sign his voluntary consent and form should be faxed to [phone].

## 2021-12-14 NOTE — Progress Notes (Signed)
Andrew Kerr is accepted to 12/14/21 after 8am.  Pt meets inpatient criteria per Melbourne Abts, PA-C.  Attending is Dr. Estill Cotta. Phone number for report is [phone]. Pt will admit to [facility]. Nursing notified: Rob Hickman, RN.   Maryjean Ka, MSW, LCSWA 12/14/2021 1:40 AM

## 2021-12-14 NOTE — Group Note (Signed)
Date:  12/14/2021 Time:  4:10 PM  Group Topic/Focus:  Wellness Toolbox:   The focus of this group is to discuss various aspects of wellness, balancing those aspects and exploring ways to increase the ability to experience wellness.  Patients will create a wellness toolbox for use upon discharge.    Participation Level:  Minimal  Participation Quality:  Appropriate  Affect:  Appropriate  Cognitive:  Appropriate  Insight: Appropriate  Engagement in Group:  Limited  Modes of Intervention:  Discussion  Additional Comments:    Jaquita Rector 12/14/2021, 4:10 PM

## 2021-12-14 NOTE — Progress Notes (Signed)
Admission Note: Patient is a 46 year old male admitted to the unit voluntarily from [facility] for depression and self harm behavior.  Patient reports stabbing himself on the thigh due to mental health crises.  Reports having a relationship issues with his wife.  Concerned about wife filing for divorce.  Stated he is here to learn coping skills and to identify triggers.  Patient presents with a blunted affect and anxious mood.  Denies suicidal thoughts, auditory and visual hallucinations.  His UDS positive for THC and Amphetamines.  Admission plan of care reviewed with consent signed.  Skin assessment and personal belongings completed.  Abrasions noted on right thigh.  No contraband found.  Patient oriented to the unit,staff and room.  Routine safety checks initiated.  Verbalizes understanding of unit rules/protocols.  Patient is safe on the unit.

## 2021-12-14 NOTE — Tx Team (Signed)
Initial Treatment Plan 12/14/2021 2:05 PM Romello Spielberger [id number]    PATIENT STRESSORS: Financial difficulties   Health problems   Marital or family conflict   Substance abuse     PATIENT STRENGTHS: Ability for insight  Communication skills  Motivation for treatment/growth  Supportive family/friends    PATIENT IDENTIFIED PROBLEMS: "Coping skills"  "Identify triggers"  Self harm behaviors  Depression  Anxiety  Ineffective coping skills           DISCHARGE CRITERIA:  Ability to meet basic life and health needs Motivation to continue treatment in a less acute level of care Safe-care adequate arrangements made  PRELIMINARY DISCHARGE PLAN: Attend aftercare/continuing care group Outpatient therapy Placement in alternative living arrangements  PATIENT/FAMILY INVOLVEMENT: This treatment plan has been presented to and reviewed with the patient, Khaliel Lenn, and/or family member.  The patient and family have been given the opportunity to ask questions and make suggestions.  Vela Prose, RN 12/14/2021, 2:05 PM

## 2021-12-14 NOTE — Progress Notes (Signed)
BHH/BMU LCSW Progress Note   12/14/2021    10:43 AM  Gentry Roch   [id number]   Type of Contact and Topic:  Psychiatric Bed Placement   Pt accepted to [facility]   Patient meets inpatient criteria per Melbourne Abts, PA-C   The attending provider will be Loleta Chance, MD  Call report to [phone]    Lilli Light, RN @ [facility] ED notified.     Pt scheduled  to arrive at [facility] TODAY.   Damita Dunnings, MSW, LCSW-A  10:44 AM 12/14/2021

## 2021-12-14 NOTE — ED Provider Notes (Signed)
Emergency Medicine Observation Re-evaluation Note  Ernest Popowski is a 46 y.o. male, seen on rounds today.  Pt initially presented to the ED for complaints of Puncture Wound and Depression (Patient states that around 8 pm his depression and anxiety "got the best of him" and he decided to stab himself in his right upper thigh with a screw driver. Patient states that at this time he does not have SI/HI and is compliant on all of his medications. Eight puncture marks noted to upper right thigh, no bleeding at this time but ecchymosis noted around puncture site.  ) Currently, the patient is Resting comfortably.  Physical Exam  BP 115/74    Pulse 67    Temp 98.2 F (36.8 C) (Oral)    Resp 18    Ht 6' (1.829 m)    Wt 97.5 kg    SpO2 99%    BMI 29.16 kg/m  Physical Exam General: Awake and alert Lungs: Resp even and unlabored Psych: Calm and cooperative  ED Course / MDM  EKG:EKG Interpretation  Date/Time:  Wednesday December 13 2021 01:55:11 EST Ventricular Rate:  59 PR Interval:  206 QRS Duration: 115 QT Interval:  430 QTC Calculation: 426 R Axis:   97 Text Interpretation: Sinus rhythm Borderline prolonged PR interval Incomplete right bundle branch block Low voltage, precordial leads Confirmed by Tilden Fossa [phone]) on 12/13/2021 3:45:18 AM  I have reviewed the labs performed to date as well as medications administered while in observation.  Recent changes in the last 24 hours include None.  Plan  Current plan is for admission to [facility].  Gentry Roch is not under involuntary commitment.     Pollyann Savoy, MD 12/14/21 1024

## 2021-12-15 ENCOUNTER — Encounter (HOSPITAL_COMMUNITY): Payer: Self-pay

## 2021-12-15 DIAGNOSIS — F319 Bipolar disorder, unspecified: Principal | ICD-10-CM

## 2021-12-15 DIAGNOSIS — F1011 Alcohol abuse, in remission: Secondary | ICD-10-CM | POA: Insufficient documentation

## 2021-12-15 DIAGNOSIS — F419 Anxiety disorder, unspecified: Secondary | ICD-10-CM

## 2021-12-15 DIAGNOSIS — F1020 Alcohol dependence, uncomplicated: Secondary | ICD-10-CM | POA: Insufficient documentation

## 2021-12-15 DIAGNOSIS — F4310 Post-traumatic stress disorder, unspecified: Secondary | ICD-10-CM

## 2021-12-15 DIAGNOSIS — F429 Obsessive-compulsive disorder, unspecified: Secondary | ICD-10-CM

## 2021-12-15 LAB — LIPID PANEL
Cholesterol: 251 mg/dL — ABNORMAL HIGH (ref 0–200)
HDL: 34 mg/dL — ABNORMAL LOW (ref >40)
LDL Cholesterol: 195 mg/dL — ABNORMAL HIGH (ref 0–99)
Total CHOL/HDL Ratio: 7.4 RATIO
Triglycerides: 109 mg/dL (ref <150)
VLDL: 22 mg/dL (ref 0–40)

## 2021-12-15 LAB — TSH: TSH: 1.562 u[IU]/mL (ref 0.350–4.500)

## 2021-12-15 MED ORDER — TEMAZEPAM 15 MG PO CAPS
30.0000 mg | ORAL_CAPSULE | Freq: Once | ORAL | Status: AC
  Administered 2021-12-15: 30 mg via ORAL
  Filled 2021-12-15: qty 2

## 2021-12-15 MED ORDER — LAMOTRIGINE 25 MG PO TABS: 50.0000 mg | ORAL_TABLET | Freq: Two times a day (BID) | ORAL | Status: DC

## 2021-12-15 MED ORDER — HYDROXYZINE HCL 50 MG PO TABS
50.0000 mg | ORAL_TABLET | Freq: Every day | ORAL | Status: DC
  Administered 2021-12-15 – 2021-12-17 (×3): 50 mg via ORAL
  Filled 2021-12-15 (×5): qty 1

## 2021-12-15 MED ORDER — LAMOTRIGINE 25 MG PO TABS
25.0000 mg | ORAL_TABLET | Freq: Every day | ORAL | Status: DC
  Administered 2021-12-15 – 2021-12-18 (×4): 25 mg via ORAL
  Filled 2021-12-15 (×6): qty 1

## 2021-12-15 MED ORDER — LAMOTRIGINE 100 MG PO TABS
100.0000 mg | ORAL_TABLET | Freq: Two times a day (BID) | ORAL | Status: DC
Start: 2022-01-19 — End: 2021-12-18

## 2021-12-15 MED ORDER — LAMOTRIGINE 25 MG PO TABS: 25.0000 mg | ORAL_TABLET | Freq: Two times a day (BID) | ORAL | Status: DC

## 2021-12-15 MED ORDER — LAMOTRIGINE 200 MG PO TABS: 200.0000 mg | ORAL_TABLET | Freq: Two times a day (BID) | ORAL | Status: DC

## 2021-12-15 NOTE — BH IP Treatment Plan (Signed)
Interdisciplinary Treatment and Diagnostic Plan Update  12/15/2021 Andrew Kerr MRN: [id number]  Principal Diagnosis: Bipolar 1 disorder (HCC)  Secondary Diagnoses: Principal Problem:   Bipolar 1 disorder (HCC) Active Problems:   Anxiety disorder, unspecified   OCD (obsessive compulsive disorder)   PTSD (post-traumatic stress disorder)   Current Medications:  Current Facility-Administered Medications  Medication Dose Route Frequency Provider Last Rate Last Admin   acetaminophen (TYLENOL) tablet 650 mg  650 mg Oral Q6H PRN Jackelyn Poling, NP       alum & mag hydroxide-simeth (MAALOX/MYLANTA) 200-200-20 MG/5ML suspension 30 mL  30 mL Oral Q4H PRN Jackelyn Poling, NP       bacitracin ointment   Topical BID Jackelyn Poling, NP   [phone] application at 12/15/21 5631   hydrOXYzine (ATARAX) tablet 25 mg  25 mg Oral QID PRN Jackelyn Poling, NP   25 mg at 12/14/21 1814   hydrOXYzine (ATARAX) tablet 50 mg  50 mg Oral QHS Mariel Craft, MD       lithium carbonate capsule 450 mg  450 mg Oral BID Nira Conn A, NP   450 mg at 12/15/21 4970   magnesium hydroxide (MILK OF MAGNESIA) suspension 30 mL  30 mL Oral Daily PRN Jackelyn Poling, NP       propranolol (INDERAL) tablet 40 mg  40 mg Oral BID Nira Conn A, NP   40 mg at 12/15/21 2637   risperiDONE (RISPERDAL) tablet 0.5 mg  0.5 mg Oral BID Nira Conn A, NP   0.5 mg at 12/15/21 8588   PTA Medications: Medications Prior to Admission  Medication Sig Dispense Refill Last Dose   acetaminophen (TYLENOL) 500 MG tablet Take 1,000 mg by mouth every 6 (six) hours as needed for moderate pain or headache.      busPIRone (BUSPAR) 7.5 MG tablet Take 1 tablet (7.5 mg total) by mouth daily. (Patient not taking: Reported on 12/13/2021) 30 tablet 0    clindamycin (CLEOCIN) 300 MG capsule Take 300 mg by mouth See admin instructions. Tid x 7 days (Patient not taking: Reported on 12/13/2021)      hydrOXYzine (VISTARIL) 25 MG capsule Take 25 mg by mouth 4 (four)  times daily as needed for anxiety.      ibuprofen (ADVIL) 200 MG tablet Take 800 mg by mouth every 6 (six) hours as needed for moderate pain or headache.      lamoTRIgine (LAMICTAL) 200 MG tablet TAKE 1 TABLET(200 MG) BY MOUTH TWICE DAILY (Patient taking differently: Take 200 mg by mouth 2 (two) times daily.) 60 tablet 1    lithium carbonate 150 MG capsule Take 3 capsules (450 mg total) by mouth 2 (two) times daily with a meal. (Patient taking differently: Take 450 mg by mouth 2 (two) times daily.) 180 capsule 0    loratadine (CLARITIN) 10 MG tablet Take 10 mg by mouth daily as needed for allergies.      NON FORMULARY CPAP at bedtime      ondansetron (ZOFRAN-ODT) 4 MG disintegrating tablet Take 4 mg by mouth every 6 (six) hours as needed for nausea or vomiting.      propranolol (INDERAL) 20 MG tablet Take 40 mg by mouth 2 (two) times daily.      risperiDONE (RISPERDAL) 1 MG tablet TAKE 1 TABLET BY MOUTH EVERY MORNING, AND 1 AND 1/2 TABLET AT BEDTIME (Patient taking differently: Take 1 mg by mouth 2 (two) times daily.) 75 tablet 0    traZODone (DESYREL)  50 MG tablet TAKE 1 TABLET(50 MG) BY MOUTH AT BEDTIME AS NEEDED FOR SLEEP (Patient not taking: Reported on 12/13/2021) 30 tablet 1     Patient Stressors: Financial difficulties   Health problems   Marital or family conflict   Substance abuse    Patient Strengths: Ability for insight  Printmaker for treatment/growth  Supportive family/friends   Treatment Modalities: Medication Management, Group therapy, Case management,  1 to 1 session with clinician, Psychoeducation, Recreational therapy.   Physician Treatment Plan for Primary Diagnosis: Bipolar 1 disorder (HCC) Long Term Goal(s):     Short Term Goals:    Medication Management: Evaluate patient's response, side effects, and tolerance of medication regimen.  Therapeutic Interventions: 1 to 1 sessions, Unit Group sessions and Medication administration.  Evaluation  of Outcomes: Progressing  Physician Treatment Plan for Secondary Diagnosis: Principal Problem:   Bipolar 1 disorder (HCC) Active Problems:   Anxiety disorder, unspecified   OCD (obsessive compulsive disorder)   PTSD (post-traumatic stress disorder)  Long Term Goal(s):     Short Term Goals:       Medication Management: Evaluate patient's response, side effects, and tolerance of medication regimen.  Therapeutic Interventions: 1 to 1 sessions, Unit Group sessions and Medication administration.  Evaluation of Outcomes: Progressing   RN Treatment Plan for Primary Diagnosis: Bipolar 1 disorder (HCC) Long Term Goal(s): Knowledge of disease and therapeutic regimen to maintain health will improve  Short Term Goals: Ability to verbalize feelings will improve, Ability to identify and develop effective coping behaviors will improve, and Compliance with prescribed medications will improve  Medication Management: RN will administer medications as ordered by provider, will assess and evaluate patient's response and provide education to patient for prescribed medication. RN will report any adverse and/or side effects to prescribing provider.  Therapeutic Interventions: 1 on 1 counseling sessions, Psychoeducation, Medication administration, Evaluate responses to treatment, Monitor vital signs and CBGs as ordered, Perform/monitor CIWA, COWS, AIMS and Fall Risk screenings as ordered, Perform wound care treatments as ordered.  Evaluation of Outcomes: Progressing   LCSW Treatment Plan for Primary Diagnosis: Bipolar 1 disorder (HCC) Long Term Goal(s): Safe transition to appropriate next level of care at discharge, Engage patient in therapeutic group addressing interpersonal concerns.  Short Term Goals: Engage patient in aftercare planning with referrals and resources, Increase emotional regulation, and Facilitate acceptance of mental health diagnosis and concerns  Therapeutic Interventions: Assess for  all discharge needs, 1 to 1 time with Social worker, Explore available resources and support systems, Assess for adequacy in community support network, Educate family and significant other(s) on suicide prevention, Complete Psychosocial Assessment, Interpersonal group therapy.  Evaluation of Outcomes: Progressing   Progress in Treatment: Attending groups: Yes. Participating in groups: Yes. Taking medication as prescribed: Yes. Toleration medication: Yes. Family/Significant other contact made: No, will contact:  wife Patient understands diagnosis: Yes. Discussing patient identified problems/goals with staff: Yes. Medical problems stabilized or resolved: Yes. Denies suicidal/homicidal ideation: Yes. Issues/concerns per patient self-inventory: No. Other: None  New problem(s) identified: No, Describe:  None  New Short Term/Long Term Goal(s):medication stabilization, elimination of SI thoughts, development of comprehensive mental wellness plan.   Patient Goals:  "go to my follow up care"  Discharge Plan or Barriers: Patient recently admitted. CSW will continue to follow and assess for appropriate referrals and possible discharge planning.   Reason for Continuation of Hospitalization: Depression Medication stabilization Suicidal ideation  Estimated Length of Stay: 3-5 days   Scribe for Treatment Team: Denny Peon  Seward Grater, Theresia Majors 12/15/2021 2:32 PM

## 2021-12-15 NOTE — Progress Notes (Addendum)
D: Patient has been eating and sleeping well; his appetite is low. Patient rates his depression and hopelessness as a 9; his anxiety is an 8. He denies any thoughts of self harm. He presents with pleasant affect; he denies any physical pain. He states he feels a little "agitated." He does not appear to be in any distress. Encourage group activities as part of his treatment plan.  A: Continue to monitor medication management and MD orders.  Safety checks completed every 15 minutes per protocol.  Offer support and encouragement as needed.  R: Patient is receptive to staff; his behavior is appropriate.     12/15/21 0900  Psych Admission Type (Psych Patients Only)  Admission Status Voluntary  Psychosocial Assessment  Patient Complaints Anxiety;Depression  Eye Contact Fair  Facial Expression Flat  Affect Anxious  Speech Logical/coherent  Interaction Assertive  Motor Activity Other (Comment) (wnl)  Appearance/Hygiene Unremarkable  Behavior Characteristics Cooperative;Anxious  Mood Pleasant;Anxious  Thought Process  Coherency WDL  Content WDL  Delusions None reported or observed  Perception WDL  Hallucination None reported or observed  Judgment WDL  Confusion WDL  Danger to Self  Current suicidal ideation? Denies  Danger to Others  Danger to Others None reported or observed

## 2021-12-15 NOTE — [facility] Suicide Risk Assessment (Signed)
[facility] INPATIENT:  Family/Significant Other Suicide Prevention Education  Suicide Prevention Education:  Education Completed; Andrew Kerr,  [phone]  (name of family member/significant other) has been identified by the patient as the family member/significant other with whom the patient will be residing, and identified as the person(s) who will aid the patient in the event of a mental health crisis (suicidal ideations/suicide attempt).  With written consent from the patient, the family member/significant other has been provided the following suicide prevention education, prior to the and/or following the discharge of the patient.  CSW spoke with wife and discussed that patient has aggressive and borderline tendencies and continues the cycle and pattern.  Patient won't follow through with therapy and is not honest about medication. Patient idolizes wife and wife has asked for seperation. She reports that she has no guns in the house and is aware of plans for patient to go to [facility] on Monday.  She reports that they need time apart and that she will find someone to pick him up on Monday. She is aware that he will drive himself there at this time.   The suicide prevention education provided includes the following: Suicide risk factors Suicide prevention and interventions National Suicide Hotline telephone number [facility] assessment telephone number [facility] Emergency Assistance 911 [facility] and/or Residential Mobile Crisis Unit telephone number  Request made of family/significant other to: Remove weapons (e.g., guns, rifles, knives), all items previously/currently identified as safety concern.   Remove drugs/medications (over-the-counter, prescriptions, illicit drugs), all items previously/currently identified as a safety concern.  The family member/significant other verbalizes understanding of the suicide prevention education information provided.  The family  member/significant other agrees to remove the items of safety concern listed above.  Catherene Kaleta E Jolie Strohecker 12/15/2021, 4:22 PM

## 2021-12-15 NOTE — Group Note (Signed)
LCSW Group Therapy Note     Group Date: 12/15/2021 Start Time: 1300 End Time: 1400   Therapy Type: Group Therapy   Participation Level:  Active   Description of Group: Patients received a worksheet with an outline of 2 faces. One side is designated for what the pt sees about themselves and the other is what others see. Pt's were asked to introduce themselves and share something they like about themself. Pts were then asked to draw, write or color how they view themselves as well as how they are viewed by others. CSW led discussion about the feelings and words associated with each side.    Patient Summary: Patient participated appropriately in group.  Patient participated in activity and shared how he felt others perceived him and how he perceives himself.  He let others know that he often gets looked at as mean or angry but that he is also very caring and kind.  Patient processed how different people view him and how that made him feel.    Persia Lintner, LCSW, LCAS Clincal Social Worker  [facility]

## 2021-12-15 NOTE — [facility] Group Notes (Signed)
Adult Psychoeducational Group Note  Date:  12/15/2021 Time:  2:43 PM  Group Topic/Focus:  Wellness Toolbox:   The focus of this group is to discuss various aspects of wellness, balancing those aspects and exploring ways to increase the ability to experience wellness.  Patients will create a wellness toolbox for use upon discharge.  Participation Level:  Active  Participation Quality:  Attentive  Affect:  Appropriate  Cognitive:  Alert  Insight: Appropriate  Engagement in Group:  Engaged  Modes of Intervention:  Activity  Additional Comments:  Patient attended and participated in the relaxation group activity.  Annie Sable 12/15/2021, 2:43 PM

## 2021-12-15 NOTE — Plan of Care (Signed)
  Problem: Education: Goal: Emotional status will improve Outcome: Not Progressing Goal: Mental status will improve Outcome: Not Progressing   

## 2021-12-15 NOTE — [facility] Group Notes (Signed)
Pt attended goals groups and stated his goal is to stay positive

## 2021-12-15 NOTE — Group Note (Signed)
Recreation Therapy Group Note   Group Topic:Problem Solving  Group Date: 12/15/2021 Start Time: 0930 End Time: [id number] Facilitators: Victorino Sparrow, LRT,CTRS Location: [address] Day[address]   Goal Area(s) Addresses:  Patient will effectively work with peer towards shared goal.  Patient will identify skills used to make activity successful.  Patient will identify how skills used during activity can be applied to reach post d/c goals.   Group Description: [employer]. In teams of 5-6, patients were given 25 small craft pipe cleaners. Using the materials provided, patients were instructed to compete again the opposing team(s) to build the tallest free-standing structure from floor level. The activity was timed; difficulty increased by Probation officer as Pharmacist, hospital continued.  Systematically resources were removed with additional directions for example, placing one arm behind their back, working in silence, and shape stipulations.    Affect/Mood: Appropriate   Participation Level: Engaged   Participation Quality: Independent   Behavior: Appropriate and Cooperative   Speech/Thought Process: Focused   Insight: Good   Judgement: Good   Modes of Intervention: Competitive Play   Patient Response to Interventions:  Engaged   Education Outcome:  Acknowledges education and In group clarification offered    Clinical Observations/Individualized Feedback: Pt was bright and seemed to be having a good time with peers in competing in activity.  Pt was appropriate and engaged throughout activity.     Plan: Continue to engage patient in RT group sessions 2-3x/week.   Victorino Sparrow, LRT,CTRS  12/15/2021 12:16 PM

## 2021-12-15 NOTE — Progress Notes (Cosign Needed)
° °  Pt presents with anxiety and depression.  Pt states, " I have racing negative thoughts that create a damaging narrative; just weird."  Pt says, "Just want a little hope."  Pt reports, " I will be going to [facility] to a 30 day CBT program."  Pt denies SI/HI/AVH, and verbally contracts for safety.  Pt administered a one time dose of Restoril.  Pt states, "cannot use Trazodone."  Pt is safe on unit with Q 15 minutes safety checks.     12/14/21 2214  Psych Admission Type (Psych Patients Only)  Admission Status Voluntary  Psychosocial Assessment  Patient Complaints Anxiety;Depression  Eye Contact Fair  Facial Expression Flat  Affect Anxious  Speech Logical/coherent  Interaction Assertive  Motor Activity Other (Comment) (WDL)  Appearance/Hygiene Unremarkable  Behavior Characteristics Cooperative;Anxious  Mood Pleasant;Anxious  Thought Process  Coherency WDL  Content WDL  Delusions None reported or observed  Perception WDL  Hallucination None reported or observed  Judgment WDL  Confusion WDL  Danger to Self  Current suicidal ideation? Denies  Danger to Others  Danger to Others None reported or observed

## 2021-12-15 NOTE — Progress Notes (Signed)
Patient attend wrap up AA group. °
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
## 2021-12-15 NOTE — BHH Suicide Risk Assessment (Signed)
[facility] Admission Suicide Risk Assessment   Nursing information obtained from:  Patient Demographic factors:  Male Current Mental Status:  Self-harm behaviors Loss Factors:  Loss of significant relationship, Financial problems / change in socioeconomic status Historical Factors:  Impulsivity Risk Reduction Factors:  Positive therapeutic relationship  Total Time spent with patient: 1 hour Principal Problem: Bipolar 1 disorder (HCC) Diagnosis:  Principal Problem:   Bipolar 1 disorder (HCC) Active Problems:   Anxiety disorder, unspecified   OCD (obsessive compulsive disorder)   PTSD (post-traumatic stress disorder)  Subjective Data: "I realize I need to be in extended treatment if I want to try and save my marriage."  Please see H&P by this Clinical research associate.  Continued Clinical Symptoms:    The "Alcohol Use Disorders Identification Test", Guidelines for Use in Primary Care, Second Edition.  World Science writer [facility]). Score between 0-7:  no or low risk or alcohol related problems. Score between 8-15:  moderate risk of alcohol related problems. Score between 16-19:  high risk of alcohol related problems. Score 20 or above:  warrants further diagnostic evaluation for alcohol dependence and treatment.   CLINICAL FACTORS:   Severe Anxiety and/or Agitation Bipolar Disorder:   Depressive phase Alcohol/Substance Abuse/Dependencies Unstable or Poor Therapeutic Relationship Previous Psychiatric Diagnoses and Treatments   Musculoskeletal: Strength & Muscle Tone: within normal limits Gait & Station: normal Patient leans: N/A  Psychiatric Specialty Exam:  Presentation  General Appearance: Casual  Eye Contact:Good  Speech:Normal Rate; Clear and Coherent  Speech Volume:Normal  Handedness:Right   Mood and Affect  Mood:Dysphoric  Affect:Congruent   Thought Process  Thought Processes:Coherent  Descriptions of Associations:Intact  Orientation:Full (Time, Place and  Person)  Thought Content:Logical  History of Schizophrenia/Schizoaffective disorder:No  Duration of Psychotic Symptoms:N/A  Hallucinations:Hallucinations: None  Ideas of Reference:None  Suicidal Thoughts:Suicidal Thoughts: Yes, Passive SI Passive Intent and/or Plan: Without Intent; Without Plan; Without Means to Carry Out; Without Access to Means  Homicidal Thoughts:Homicidal Thoughts: No   Sensorium  Memory:Immediate Good; Recent Good; Remote Good  Judgment:Fair  Insight:Fair   Executive Functions  Concentration:Good  Attention Span:Good  Recall:Good  Fund of Knowledge:Good  Language:Good   Psychomotor Activity  Psychomotor Activity:Psychomotor Activity: Normal   Assets  Assets:Communication Skills; Desire for Improvement; Financial Resources/Insurance; Housing; Intimacy; Resilience; Social Support   Sleep  Sleep:Sleep: Fair    Physical Exam: Physical Exam ROS Blood pressure (!) 112/92, pulse 84, temperature 97.9 F (36.6 C), temperature source Oral, resp. rate 18, height 5' 11.65" (1.82 m), weight 94.8 kg, SpO2 100 %. Body mass index is 28.62 kg/m. Physical Exam Vitals and nursing note reviewed.  HENT:     Head: Normocephalic and atraumatic.  Cardiovascular:     Pulses: Normal pulses.  Pulmonary:     Effort: Pulmonary effort is normal. No respiratory distress.  Musculoskeletal:        General: Normal range of motion.  Neurological:     General: No focal deficit present.     Mental Status: He is oriented to person, place, and time.    Review of Systems  Constitutional: Negative.   Respiratory: Negative.    Cardiovascular: Negative.   Gastrointestinal: Negative.   Musculoskeletal: Negative.   Neurological: Negative.   Psychiatric/Behavioral:  Positive for depression, substance abuse and suicidal ideas. Negative for hallucinations and memory loss. The patient is nervous/anxious and has insomnia.    COGNITIVE FEATURES THAT CONTRIBUTE TO  RISK:  Polarized thinking    SUICIDE RISK:   Moderate:  Frequent suicidal ideation with  limited intensity, and duration, some specificity in terms of plans, no associated intent, good self-control, limited dysphoria/symptomatology, some risk factors present, and identifiable protective factors, including available and accessible social support.  PLAN OF CARE:   Treatment Plan Summary: Daily contact with patient to assess and evaluate symptoms and progress in treatment and Medication management   Observation Level/Precautions:  15 minute checks  Laboratory:   Reviewed labs from emergency department with urine drug screen positive for amphetamines and THC; lithium level was low , EtOH level less than 10, CMP with elevated blood glucose and creatinine, otherwise unremarkable, CBC unremarkable  Psychotherapy: Encourage patient to interact in milieu and attend group therapies  Medications: Home medications have been restarted; there is a question as to when he last took Lamictal, will really titrate from a low dose as to prevent Stevens-Johnson syndrome.  Consultations: As indicated  Discharge Concerns: Medication compliance, substance use, patient and family safety  Estimated LOS: 3-5 days  Other: Plan to discharge to [facility] residential treatment center for CBT therapy for 30 days after acute hospitalization.  Patient's contact at the facility is [place]: [phone]    Physician Treatment Plan for Primary Diagnosis: Bipolar 1 disorder (HCC) Long Term Goal(s): Improvement in symptoms so as ready for discharge   Short Term Goals: Ability to identify changes in lifestyle to reduce recurrence of condition will improve, Ability to verbalize feelings will improve, Ability to disclose and discuss suicidal ideas, Ability to demonstrate self-control will improve, Ability to identify and develop effective coping behaviors will improve, Ability to maintain clinical measurements within normal limits will  improve, Compliance with prescribed medications will improve, and Ability to identify triggers associated with substance abuse/mental health issues will improve   Physician Treatment Plan for Secondary Diagnosis: Principal Problem:   Bipolar 1 disorder (HCC) Active Problems:   Anxiety disorder, unspecified   OCD (obsessive compulsive disorder)   PTSD (post-traumatic stress disorder)   Long Term Goal(s): Improvement in symptoms so as ready for discharge   Short Term Goals: Ability to identify changes in lifestyle to reduce recurrence of condition will improve, Ability to verbalize feelings will improve, Ability to disclose and discuss suicidal ideas, Ability to demonstrate self-control will improve, Ability to identify and develop effective coping behaviors will improve, Ability to maintain clinical measurements within normal limits will improve, Compliance with prescribed medications will improve, and Ability to identify triggers associated with substance abuse/mental health issues will improve   I certify that inpatient services furnished can reasonably be expected to improve the patient's condition.   Mariel Craft, MD 12/15/2021, 6:30 PM

## 2021-12-15 NOTE — BHH Counselor (Signed)
CSW called [facility] and they confirmed that they have a bed available on Monday.  They requested for CSW to fax paperwork from hospital stay.  Contact information below:  Andrew [facility] [phone] [address] [place], [place] [address]   Anson Oregon, LCSW, LCAS Clincal Social Worker  [facility]

## 2021-12-16 LAB — HEMOGLOBIN A1C
Hgb A1c MFr Bld: 5.1 % (ref 4.8–5.6)
Mean Plasma Glucose: 100 mg/dL

## 2021-12-16 MED ORDER — TEMAZEPAM 15 MG PO CAPS
30.0000 mg | ORAL_CAPSULE | Freq: Once | ORAL | Status: AC
  Administered 2021-12-16: 30 mg via ORAL
  Filled 2021-12-16: qty 2

## 2021-12-16 NOTE — Progress Notes (Signed)
° ° °   12/15/21 2150  Psych Admission Type (Psych Patients Only)  Admission Status Voluntary  Psychosocial Assessment  Patient Complaints Anxiety;Other (Comment) (Excited; Thankful)  Eye Contact Fair  Facial Expression Flat  Affect Anxious  Speech Logical/coherent  Interaction Assertive  Motor Activity Other (Comment) (WDL)  Appearance/Hygiene Unremarkable  Behavior Characteristics Cooperative  Mood Pleasant;Anxious  Thought Process  Coherency WDL  Content WDL  Delusions None reported or observed  Perception WDL  Hallucination None reported or observed  Judgment WDL  Confusion WDL  Danger to Self  Current suicidal ideation? Denies  Danger to Others  Danger to Others None reported or observed

## 2021-12-16 NOTE — Progress Notes (Signed)
BHH Group Notes:  (Nursing/MHT/Case Management/Adjunct)  Date:  12/16/2021  Time:  2015  Type of Therapy:   wrap up group  Participation Level:  Active  Participation Quality:  Appropriate, Attentive, Sharing, and Supportive  Affect:  Appropriate  Cognitive:  Alert  Insight:  Improving  Engagement in Group:  Engaged  Modes of Intervention:  Clarification, Education, and Support  Summary of Progress/Problems: Positive thinking and positive change were discussed.   Andrew Kerr 12/16/2021, 8:48 PM

## 2021-12-16 NOTE — BHH Group Notes (Signed)
Goals Group 12/16/2021   Group Focus: affirmation, clarity of thought, and goals/reality orientation Treatment Modality:  Psychoeducation Interventions utilized were assignment, group exercise, and support Purpose: To be able to understand and verbalize the reason for their admission to the hospital. To understand that the medication helps with their chemical imbalance but they also need to work on their choices in life. To be challenged to develop a list of 30 positives about themselves. Also introduce the concept that "feelings" are not reality.  Participation Level:  Active  Participation Quality:  Appropriate  Affect:  Appropriate  Cognitive:  Appropriate  Insight:  Improving  Engagement in Group:  Engaged  Additional Comments:  Rates his energy at a 6/10. Shared in the group.  Dione Housekeeper

## 2021-12-16 NOTE — Progress Notes (Addendum)
Pt focused on his medications, pt stated he was taking 200 mg Lamictal BID ,and only 150 mg Lithium BID , pt encouraged to talk to the doctor about differences  12/15/21 2150  Psych Admission Type (Psych Patients Only)  Admission Status Voluntary  Psychosocial Assessment  Patient Complaints Anxiety;Other (Comment) (Excited; Thankful)  Eye Contact Fair  Facial Expression Flat  Affect Anxious  Speech Logical/coherent  Interaction Assertive  Motor Activity Other (Comment) (WDL)  Appearance/Hygiene Unremarkable  Behavior Characteristics Cooperative  Mood Pleasant;Anxious  Thought Process  Coherency WDL  Content WDL  Delusions None reported or observed  Perception WDL  Hallucination None reported or observed  Judgment WDL  Confusion WDL  Danger to Self  Current suicidal ideation? Denies  Danger to Others  Danger to Others None reported or observed

## 2021-12-16 NOTE — BHH Group Notes (Signed)
.  Psychoeducational Group Note    Date:12/16/21 Time: 1300-1400    Purpose of Group: . The group focus' on teaching patients on how to identify their needs and their Life Skills:  A group where two lists are made. What people need and what are things that we do that are unhealthy. The lists are developed by the patients and it is explained that we often do the actions that are not healthy to get our list of needs met.  Goal:: to develop the coping skills needed to get their needs met  Participation Level:  Active  Participation Quality:  Appropriate  Affect:  Appropriate  Cognitive:  Oriented  Insight:  Improving  Engagement in Group:  Engaged  Additional Comments: Pt rates himself as a 6.5/10. Participated in the group. Gave and received feedback  Paulino Rily

## 2021-12-16 NOTE — Progress Notes (Addendum)
D. Pt presented with a sad affect affect/ mood- calm, cooperative behavior- rated his depression, hopelessness and anxiety a 7/6/7, respectively. Pt reported that he slept "awesome", but endorsed poor concentration, stated, "It's always poor." Pt currently denies SI/HI and AVH  A. Labs and vitals monitored. Pt given and educated on medications. Pt supported emotionally and encouraged to express concerns and ask questions.   R. Pt remains safe with 15 minute checks. Will continue POC.

## 2021-12-16 NOTE — Plan of Care (Signed)
°  Problem: Safety: Goal: Periods of time without injury will increase Outcome: Progressing   Problem: Safety: Goal: Periods of time without injury will increase Outcome: Progressing   Problem: Safety: Goal: Periods of time without injury will increase Outcome: Progressing   Problem: Safety: Goal: Periods of time without injury will increase Outcome: Progressing   Problem: Safety: Goal: Periods of time without injury will increase Outcome: Progressing   Problem: Safety: Goal: Periods of time without injury will increase Outcome: Progressing   Problem: Safety: Goal: Periods of time without injury will increase Outcome: Progressing

## 2021-12-16 NOTE — Group Note (Signed)
LCSW Group Therapy   Due to high patient acuity and staffing, group was unable to be held on 12/16/2021. The CSW supervisor as well as the on-duty [facility] was made aware.  Jinan Biggins T Kazi Montoro LCSWA  2:40 PM
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
## 2021-12-17 MED ORDER — TEMAZEPAM 15 MG PO CAPS
30.0000 mg | ORAL_CAPSULE | Freq: Once | ORAL | Status: AC
  Administered 2021-12-17: 30 mg via ORAL
  Filled 2021-12-17: qty 2

## 2021-12-17 MED ORDER — RISPERIDONE 1 MG PO TABS
1.0000 mg | ORAL_TABLET | Freq: Two times a day (BID) | ORAL | Status: DC
  Administered 2021-12-17 – 2021-12-18 (×2): 1 mg via ORAL
  Filled 2021-12-17 (×4): qty 1

## 2021-12-17 MED ORDER — NICOTINE POLACRILEX 2 MG MT GUM
2.0000 mg | CHEWING_GUM | OROMUCOSAL | Status: DC
  Administered 2021-12-17 – 2021-12-18 (×2): 2 mg via ORAL
  Filled 2021-12-17 (×9): qty 1
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
## 2021-12-17 NOTE — [facility] Group Notes (Signed)
Adult Psychoeducational Group Not Date:  12/17/2021 Time:  2376-2831 Group Topic/Focus: PROGRESSIVE RELAXATION. A group where deep breathing is taught and tensing and relaxation muscle groups is used. Imagery is used as well.  Pts are asked to imagine 3 pillars that hold them up when they are not able to hold themselves up.  Participation Level:  Active  Participation Quality:  Appropriate  Affect:  Appropriate  Cognitive:  Oriented  Insight: Improving  Engagement in Group:  Engaged  Modes of Intervention:  Activity, Discussion, Education, and Support  Additional Comments:  Rates his energy at an 8/10. States fear holds him up.  Dione Housekeeper

## 2021-12-17 NOTE — Progress Notes (Signed)
°   12/17/21 0545  Sleep  Number of Hours 6

## 2021-12-17 NOTE — [facility] Group Notes (Signed)
Adult Psychoeducational Group Not Date:  12/17/2021 Time:  [id number] Group Topic/Focus: PROGRESSIVE RELAXATION. A group where deep breathing is taught and tensing and relaxation muscle groups is used. Imagery is used as well.  Pts are asked to imagine 3 pillars that hold them up when they are not able to hold themselves up.  Participation Level:  Active  Participation Quality:  Appropriate  Affect:  Appropriate  Cognitive:  Oriented  Insight: Improving  Engagement in Group:  Engaged  Modes of Intervention:  Activity, Discussion, Education, and Support  Additional Comments:  Pt rates his energy at an 8/10. Participated fully in the group, giving and receiving feddback.  Paulino Rily

## 2021-12-17 NOTE — Progress Notes (Signed)
D. Pt presented somewhat anxious upon initial approach this am voicing concern about his medication, but was reassured that he would be able to speak with his provider later today. Pt rated his depression, hopelessness and anxiety a 6/4/6, respectively. Pt endorsed having slept well last night,  having a good appetite, good concentration, and normal energy level today. Pt currently denies SI/HI and AVH  A. Labs and vitals monitored. Pt given and educated on medications. Pt supported emotionally and encouraged to express concerns and ask questions.   R. Pt remains safe with 15 minute checks. Will continue POC.

## 2021-12-17 NOTE — [facility] Group Notes (Signed)
Pt attended goals group and participated in discussion. 

## 2021-12-17 NOTE — Group Note (Signed)
LCSW Group Therapy Note ° ° °Group Date: 12/17/2021 °Start Time: 1000 °End Time: 1100 ° ° °Type of Therapy and Topic:  Group Therapy:  ° °LCSW Group Therapy °  °  °Due to high patient acuity and staffing, group was unable to be held on 12/17/2021. The CSW supervisor as well as the on-duty AC was made aware. ° ° ° °Jesper Stirewalt A Clanton Emanuelson, LCSW °12/17/2021  12:30 PM   ° °

## 2021-12-18 MED ORDER — LAMOTRIGINE 25 MG PO TABS: 50.0000 mg | ORAL_TABLET | Freq: Two times a day (BID) | ORAL | 0 refills | Status: AC

## 2021-12-18 MED ORDER — LAMOTRIGINE 100 MG PO TABS: 100.0000 mg | ORAL_TABLET | Freq: Two times a day (BID) | ORAL | 0 refills | Status: AC

## 2021-12-18 MED ORDER — LITHIUM CARBONATE 150 MG PO CAPS: 450.0000 mg | ORAL_CAPSULE | Freq: Two times a day (BID) | ORAL | 0 refills | Status: AC

## 2021-12-18 MED ORDER — HYDROXYZINE HCL 25 MG PO TABS: 25.0000 mg | ORAL_TABLET | Freq: Four times a day (QID) | ORAL | 0 refills | Status: AC | PRN

## 2021-12-18 MED ORDER — NICOTINE POLACRILEX 2 MG MT GUM: 2.0000 mg | CHEWING_GUM | OROMUCOSAL | 0 refills | Status: AC

## 2021-12-18 MED ORDER — HYDROXYZINE HCL 50 MG PO TABS: 50.0000 mg | ORAL_TABLET | Freq: Every day | ORAL | 0 refills | Status: AC

## 2021-12-18 MED ORDER — RISPERIDONE 1 MG PO TABS: 1.0000 mg | ORAL_TABLET | Freq: Two times a day (BID) | ORAL | 0 refills | Status: AC

## 2021-12-18 MED ORDER — LAMOTRIGINE 25 MG PO TABS: 25.0000 mg | ORAL_TABLET | Freq: Two times a day (BID) | ORAL | 0 refills | Status: AC

## 2021-12-18 MED ORDER — PROPRANOLOL HCL 40 MG PO TABS: 40.0000 mg | ORAL_TABLET | Freq: Two times a day (BID) | ORAL | 0 refills | Status: AC

## 2021-12-18 NOTE — [facility] Group Notes (Signed)
Orientation Group and Psychoeducational teaching. Patients were asked to share how they are feeling mentally today, and unit and ward rules, expectations were discussed. Psychoeducational teaching was then discussed with habits of mindfulness and positive reframing techniques to improve mental health. The patients were given a poem of the Dalia Lama and asked one technique to use to help calm anxiety and negative thoughts. The patient attended group.  °

## 2021-12-18 NOTE — Plan of Care (Signed)
Nurse discussed discharge paperwork with patient.

## 2021-12-18 NOTE — Progress Notes (Signed)
°  [facility] Adult Case Management Discharge Plan :  Will you be returning to the same living situation after discharge:  No. Patient going to [facility] At discharge, do you have transportation home?: No. Patient need Taxi  Do you have the ability to pay for your medications: Yes,  insurance  Release of information consent forms completed and in the chart;  Patient's signature needed at discharge.  Patient to Follow up at:  Follow-up Information     [facility] Follow up.   Why: A referral has been made to this provider for residential treatment services. Contact information: [address], [place] [address]  P:  [phone] F:  [phone]        [facility]. Call.   Specialty: Behavioral Health Why: If you would like to see this provider for outpatient therapy and/or medication management services, please call to schedule an appointment. Contact information: [address] [place] [address] [phone]        Margarite Gouge, PsyD Follow up on 12/27/2021.   Specialty: Psychology Why: You have an appointment with this provider for therapy services on 12/27/21 at 12:00 pm. This will be a Virtual appointment. Contact information: [address] [place] [address] [phone]                 Next level of care provider has access to [facility] Link:yes  Safety Planning and Suicide Prevention discussed: Yes,  wife Lyla Son     Has patient been referred to the Quitline?: N/A patient is not a smoker  Patient has been referred for addiction treatment: N/A, [facility]  [employer], LCSW 12/18/2021, 10:16 AM
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
## 2021-12-18 NOTE — Progress Notes (Signed)
D:  Patient denied SI and HI, contracts for safety.  Denied A/V hallucinations.  Denied pain. A:  Medications administered per MD orders.  Emotional support and encouragement given patient. R:  Safety maintained with 15 minute checks.  

## 2021-12-18 NOTE — Plan of Care (Signed)
°  Problem: Education: Goal: Emotional status will improve Outcome: Progressing Goal: Mental status will improve Outcome: Progressing   Problem: Coping: Goal: Ability to demonstrate self-control will improve Outcome: Progressing   Problem: Health Behavior/Discharge Planning: Goal: Identification of resources available to assist in meeting health care needs will improve Outcome: Progressing

## 2021-12-18 NOTE — Plan of Care (Signed)
Nurse discussed coping skills with patient.  

## 2021-12-18 NOTE — Progress Notes (Signed)
Patient stated lamictal 25 mg dose level is not what it needs to be.  In good spirits and ready to discharge for follow up treatment in [place].
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
## 2021-12-18 NOTE — Progress Notes (Signed)
Discharge Note:  Patient discharged home via taxi and plans to go to [facility] for 30 day treatment.  Suicide prevention information given and discussed with patient who stated he understood and had no questions.  Patient denied SI and HI.  Denied A/V hallucinations.  Patient stated he appreciated all assistance received from [facility] staff.  Patient stated he received all his belongings at discharge.  All required discharge information given.

## 2021-12-27 ENCOUNTER — Ambulatory Visit: Admitting: Psychology

## 2021-12-27 DIAGNOSIS — F4312 Post-traumatic stress disorder, chronic: Secondary | ICD-10-CM

## 2021-12-27 DIAGNOSIS — F902 Attention-deficit hyperactivity disorder, combined type: Secondary | ICD-10-CM

## 2021-12-27 DIAGNOSIS — F332 Major depressive disorder, recurrent severe without psychotic features: Secondary | ICD-10-CM

## 2021-12-27 NOTE — Progress Notes (Signed)
This Probation officer contacted Andrew Kerr as he had not presented for the video appointment. Andrew Kerr noted he did not realize there was a scheduled appointment and he was in [place]. This Probation officer explained he is not licensed in [place], and would be unable to meet with him today as a result. He denied having any significant issues or concerns, and stated he is a "chilling with a buddy" and "figuring some things out." He stated he would contact this writer's office if he needed [place] mental health referral options and/or to get scheduled with this Probation officer for a time he is back in [place]. He denied having any other issues or concerns.   ? ?No billing code for the check-in.  ? ? ? ? ? ? ? ? ? ? ? ? ? ? ?Dolores Lory, PsyD ?
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
